# Patient Record
Sex: Male | Born: 1995 | Race: White | Hispanic: No | Marital: Single
Health system: Midwestern US, Community
[De-identification: ages and names within clinical notes are randomized; demographics above are authoritative.]

## PROBLEM LIST (undated history)

## (undated) DIAGNOSIS — R7303 Prediabetes: Secondary | ICD-10-CM

## (undated) DIAGNOSIS — E669 Obesity, unspecified: Secondary | ICD-10-CM

## (undated) DIAGNOSIS — R197 Diarrhea, unspecified: Secondary | ICD-10-CM

## (undated) DIAGNOSIS — K219 Gastro-esophageal reflux disease without esophagitis: Secondary | ICD-10-CM

## (undated) DIAGNOSIS — Z889 Allergy status to unspecified drugs, medicaments and biological substances status: Secondary | ICD-10-CM

## (undated) DIAGNOSIS — E785 Hyperlipidemia, unspecified: Secondary | ICD-10-CM

## (undated) DIAGNOSIS — F419 Anxiety disorder, unspecified: Secondary | ICD-10-CM

## (undated) DIAGNOSIS — R519 Headache, unspecified: Secondary | ICD-10-CM

## (undated) DIAGNOSIS — Z8489 Family history of other specified conditions: Secondary | ICD-10-CM

## (undated) DIAGNOSIS — I10 Essential (primary) hypertension: Secondary | ICD-10-CM

## (undated) DIAGNOSIS — K760 Fatty (change of) liver, not elsewhere classified: Secondary | ICD-10-CM

## (undated) DIAGNOSIS — R51 Headache: Secondary | ICD-10-CM

## (undated) DIAGNOSIS — E119 Type 2 diabetes mellitus without complications: Secondary | ICD-10-CM

## (undated) HISTORY — PX: ORIF TIBIA & FIBULA FRACTURES: SHX2131

## (undated) HISTORY — DX: Hyperlipidemia, unspecified: E78.5

## (undated) HISTORY — PX: WISDOM TOOTH EXTRACTION: SHX21

## (undated) HISTORY — PX: TONSILLECTOMY AND ADENOIDECTOMY: SUR1326

## (undated) HISTORY — PX: CIRCUMCISION: SUR203

## (undated) HISTORY — DX: Prediabetes: R73.03

## (undated) HISTORY — DX: Obesity, unspecified: E66.9

---
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
## 2017-09-19 ENCOUNTER — Other Ambulatory Visit: Payer: Self-pay | Admitting: Gastroenterology

## 2017-09-24 ENCOUNTER — Encounter (HOSPITAL_COMMUNITY): Payer: Self-pay | Admitting: *Deleted

## 2017-09-24 ENCOUNTER — Other Ambulatory Visit: Payer: Self-pay

## 2017-09-24 NOTE — H&P (Signed)
History of Present Illness General: 22/male was referred for diarrhea. He reports symptoms for many years, intermittent diarrhea, fecal urgency, he has burning upper abdomen and generalized abdominal pain. It likely started at least 7 years, he normally has 3-5 bms/day, it is watery, non bloody, not black, occasionally he has formed stools, episodes of diarrhea usually occur 1-2/week, they last for half a day, he has nocturnal diarrhea and abdominal pain. He has acid reflux and takes TUMS as needed and it helps, denies difficulty or pain on swallowing. Denies weight fluctuations, he has a good appetite. He denies food triggers. No skin rash, no joint pain or oral uclers. There is family history of Crohn, UC, Celiac, colon cancer. No prior EGD or colonoscopy.Abdominal pain is etiher in upper abdomen or lower abdomen and may improve after a BM. It can be dull or sharp. He is on a probiotic and it has not made much of a difference.  Current Medications Taking Vitamin B-12 ER 1000 MCG Tablet Extended Release Oral Lisinopril 20 MG Tablet Oral Atorvastatin Calcium 20 MG Tablet Oral Fexofenadine HCl 180 MG Tablet 1 tablet as needed Orally Once a day Vitamin D (Cholecalciferol) 1000 UNIT Tablet 1 tablet Orally Once a day Probiotic - Capsule as directed Orally Tums(Calcium Carbonate Antacid) 500 MG Tablet Chewable 1 tablet Orally Once a day Medication List reviewed and reconciled with the patient Past Medical History Hypertension. Prediabetes; when he was teenage. Asthma, Child. Surgical History Tonsillectomy and Adenoidectomy Fracture tiba and fibula; right leg 2007 Perianal Cyst removed Family History Mother: alive Paternal Grand Father: Colon cancer Maternal Grand Father: Liver cancer No Family History of Polyps. Social History General: Tobacco use cigarettes: Never smoked Tobacco history last updated 09/19/2017 Alcohol: occasionally, beer. Recreational drug use:  no. Allergies N.K.D.A. Hospitalization/Major Diagnostic Procedure childbirth Not in the past year 09/2017 Review of Systems CONSTITUTIONAL: Chills No. Fatigue No. Fever No. Insomnia No. Night sweats No. Weightchange No. HEENT: Change in vision No. Double vision No . Hoarseness No. Nose bleeds No. sore throat No. Sinus Problems No. Glaucoma No. CARDIOLOGY: ByPass Surgery No. Poor Circulation No. Artificial Heart Valves No. High blood pressure YES. History of Heart attack No. Irregular heart beat No. Known coronary artery disease No. Pacemaker/Defibrillator No. RESPIRATORY: Shortness of breath No. Cough YES. Excessive Sputum No. Using Oxygen No. Asthma YES. Bronchitis No. Pneumonia No. Sleep Apnea No. UROLOGY: Interstitials Cystitis No. Incontinence No. Prostate problems No. Blood in urine No. Difficulty urinating No. Kidney disease No. Kidney stones No. GASTROENTEROLOGY: Abdominal pain YES. Acid reflux YES. Black stools No. Bloating/belching YES. Change in bowel habits YES. Constipation No. Dark tarry stools No. Diarrhea YES. Difficulty swallowing No. Heartburn YES. Incontinence of stool No. Indigestion: YES. Lactose intolerance No. Nausea No. Rectal bleeding No. Vomiting No. Hepatitis/yellow jaundice No. History of Ulcers No. Use of Pain Medication No. Previous Colonoscopy No. MUSCULOSKELETAL: joint pain No. Arthritis No. Joint Replacement No. NEUROLOGY: Dizziness No. Fainting No. Headache No. Paralysis No. Seizures No. Strokes No. Weakness No. Alzheimer's No. SKIN: Rash No. Bruises easily No. ENDOCRINOLOGY: Diabetes No. High cholesterol No. Thyroid disorder No. HEMATOLOGY/LYMPH: Abnormal bleeding No. Anemia No. Enlarged lymph nodes No. Past blood transfusion No. Swollen glands No. Blood Clots No. Using Blood Thinners No. INFECTIOUS DISEASE: HIV/AIDS No. Tuberculosis No . Hepatitis No. Sexually Transmitted Disease No. MRSA No. GI PROCEDURE: no  Pacemaker/ AICD, no. no Artificial heart valves. no MI/heart attack. no Abnormal heart rhythm. no Angina. no CVA. Hypertension YES. no Hypotension. no Asthma, COPD. no  Sleep apnea. no Seizure disorders. no Artificial joints. no Severe DJD. no Diabetes. no Significant headaches. no Vertigo.  Vital Signs Wt 408.2, Ht 72, BMI 55.36, Temp 99.5, Pulse sitting 118, BP sitting 129/79. Examination Gastroenterology:: GENERAL APPEARANCE: Well developed,obese, pleasant, no acute distress. EYES: Lids and conjunctiva normal. Sclera normal. ORAL CAVITY: Lips, teeth and gums are normal. Pharynx, tongue, mucosa normal . SCLERA: anicteric . NECK Full ROM, trachea midline, no thyromegaly or masses . CARDIOVASCULAR PMI LS border. Normal RRR w/o murmers or gallops. No peripheral edema . RESPIRATORY Breath sounds normal. Respiration even and unlabored . ABDOMEN No masses palpated. Liver and spleen not palpated, normal. Bowel sounds normal, Abdomen not distended . EXTREMITIES: No edema, pulses intact . NEURO: normal strength and reflexes, cranial nerves II-XII grossly intact, normal gait . PSYCH: mood/affect normal .   Assessments 1. Diarrhea, unspecified type - R19.7 (Primary) 2. Fatty liver - K76.0   Treatment  1. Diarrhea, unspecified type LAB: C difficile Toxins A+B, EIA (161096) (Ordered for 09/19/2017) LAB: Ova + Parasite; Giardia EIA ([id number]) (Ordered for 09/19/2017) IMAGING: Colonoscopy Whitfield,Dia 09/19/2017 04:05:59 PM > spoke with Kendallscheduled for 09/25/17-[facility] at 9am-prep instructions reviewed with pt. IMAGING: Esophagoscopy Whitfield,Dia 09/19/2017 04:05:59 PM > spoke with Kendallscheduled for 09/25/17-[facility] at 9am-prep instructions reviewed with pt. Notes: Less likely to be infectious as this is been ongoing for several years unless he has spores for C diff or chronic giardia. Will proceed with a diagnostic endoscopy and small bowel biopsies to rule out celiac disease along  with a colonoscopy and random colon biopsies. The risk and benefits of the procedure we discussed with the patient in details. He understands and verbalizes consent. He will be given written instructions, prescription for her preparation and will be scheduled for the same. Referral To: Reason:endo-colon-propofol-spoke with Kendall-[facility]-#[id number]  2. Fatty liver Notes: Patient states that he recently had an ultrasound by his primary care physician which showed fatty liver and was told that his AST/ALT was slightly elevated. Discussed about importance of weight loss, a fatty liver is unchecked there is risk of developing fibrosis and cirrhosis. He seems to understand. Advised to take vitamin E 400 units 2 capsules daily.

## 2017-09-24 NOTE — Progress Notes (Signed)
Pt denies SOB, chest pain, and being under the care of a cardiologist. Pt denies having a stress test, echo and cardiac cath. Pt denies having an EKG and chest x ray within the last year. Requested recent labs and LOV note from PCP, [place]. Pt made aware to stop taking vitamins, fish oil and herbal medications. Do not take any NSAIDs ie: Ibuprofen, Advil, Naproxen (Aleve), Motrin, BC and Goody Powder. Pt verbalized understanding of all pre-op instructions.

## 2017-09-24 NOTE — Progress Notes (Signed)
   09/24/17 1409  OBSTRUCTIVE SLEEP APNEA  Have you ever been diagnosed with sleep apnea through a sleep study? No  Do you snore loudly (loud enough to be heard through closed doors)?  0  Do you often feel tired, fatigued, or sleepy during the daytime (such as falling asleep during driving or talking to someone)? 0  Has anyone observed you stop breathing during your sleep? 0  Do you have, or are you being treated for high blood pressure? 1  BMI more than 35 kg/m2? 1  Age > 50 (1-yes) 0  Neck circumference greater than:Male 16 inches or larger, Male 17inches or larger? 1  Male Gender (Yes=1) 1  Obstructive Sleep Apnea Score 4

## 2017-09-25 ENCOUNTER — Encounter (HOSPITAL_COMMUNITY): Payer: Self-pay

## 2017-09-25 ENCOUNTER — Ambulatory Visit (HOSPITAL_COMMUNITY): Payer: BLUE CROSS/BLUE SHIELD | Admitting: Anesthesiology

## 2017-09-25 ENCOUNTER — Ambulatory Visit (HOSPITAL_COMMUNITY)
Admission: RE | Admit: 2017-09-25 | Discharge: 2017-09-25 | Disposition: A | Discharge status: Home / Self Care | Payer: BLUE CROSS/BLUE SHIELD | Source: Ambulatory Visit | Attending: Gastroenterology | Admitting: Gastroenterology

## 2017-09-25 ENCOUNTER — Encounter (HOSPITAL_COMMUNITY)
Admission: RE | Disposition: A | Discharge status: Home / Self Care | Payer: Self-pay | Source: Ambulatory Visit | Attending: Gastroenterology

## 2017-09-25 DIAGNOSIS — K760 Fatty (change of) liver, not elsewhere classified: Secondary | ICD-10-CM | POA: Insufficient documentation

## 2017-09-25 DIAGNOSIS — K219 Gastro-esophageal reflux disease without esophagitis: Secondary | ICD-10-CM | POA: Insufficient documentation

## 2017-09-25 DIAGNOSIS — K529 Noninfective gastroenteritis and colitis, unspecified: Secondary | ICD-10-CM | POA: Insufficient documentation

## 2017-09-25 DIAGNOSIS — K5730 Diverticulosis of large intestine without perforation or abscess without bleeding: Secondary | ICD-10-CM | POA: Diagnosis not present

## 2017-09-25 DIAGNOSIS — Z6841 Body Mass Index (BMI) 40.0 and over, adult: Secondary | ICD-10-CM | POA: Diagnosis not present

## 2017-09-25 DIAGNOSIS — Z800 Family history of malignant neoplasm of digestive organs: Secondary | ICD-10-CM | POA: Diagnosis not present

## 2017-09-25 DIAGNOSIS — Z79899 Other long term (current) drug therapy: Secondary | ICD-10-CM | POA: Insufficient documentation

## 2017-09-25 DIAGNOSIS — R1084 Generalized abdominal pain: Secondary | ICD-10-CM | POA: Insufficient documentation

## 2017-09-25 DIAGNOSIS — R152 Fecal urgency: Secondary | ICD-10-CM | POA: Insufficient documentation

## 2017-09-25 DIAGNOSIS — I10 Essential (primary) hypertension: Secondary | ICD-10-CM | POA: Diagnosis not present

## 2017-09-25 HISTORY — DX: Family history of other specified conditions: Z84.89

## 2017-09-25 HISTORY — PX: ESOPHAGOGASTRODUODENOSCOPY: SHX5428

## 2017-09-25 HISTORY — DX: Allergy status to unspecified drugs, medicaments and biological substances: Z88.9

## 2017-09-25 HISTORY — DX: Essential (primary) hypertension: I10

## 2017-09-25 HISTORY — DX: Headache: R51

## 2017-09-25 HISTORY — PX: COLONOSCOPY: SHX5424

## 2017-09-25 HISTORY — DX: Diarrhea, unspecified: R19.7

## 2017-09-25 HISTORY — DX: Headache, unspecified: R51.9

## 2017-09-25 HISTORY — PX: BIOPSY: SHX5522

## 2017-09-25 HISTORY — DX: Gastro-esophageal reflux disease without esophagitis: K21.9

## 2017-09-25 HISTORY — DX: Fatty (change of) liver, not elsewhere classified: K76.0

## 2017-09-25 SURGERY — COLONOSCOPY
Anesthesia: Monitor Anesthesia Care

## 2017-09-25 MED ORDER — LIDOCAINE 2% (20 MG/ML) 5 ML SYRINGE
INTRAMUSCULAR | Status: DC | PRN
  Administered 2017-09-25: 100 mg via INTRAVENOUS

## 2017-09-25 MED ORDER — PROPOFOL 10 MG/ML IV BOLUS
INTRAVENOUS | Status: DC | PRN
  Administered 2017-09-25 (×4): 50 mg via INTRAVENOUS
  Administered 2017-09-25: 30 mg via INTRAVENOUS
  Administered 2017-09-25: 40 mg via INTRAVENOUS
  Administered 2017-09-25 (×2): 50 mg via INTRAVENOUS

## 2017-09-25 MED ORDER — GLYCOPYRROLATE PF 0.2 MG/ML IJ SOSY
PREFILLED_SYRINGE | INTRAMUSCULAR | Status: DC | PRN
  Administered 2017-09-25: .3 mg via INTRAVENOUS

## 2017-09-25 MED ORDER — LACTATED RINGERS IV SOLN
INTRAVENOUS | Status: DC
  Administered 2017-09-25 (×2): via INTRAVENOUS

## 2017-09-25 MED ORDER — PROPOFOL 500 MG/50ML IV EMUL
INTRAVENOUS | Status: DC | PRN
  Administered 2017-09-25: 10:00:00 via INTRAVENOUS
  Administered 2017-09-25: 125 ug/kg/min via INTRAVENOUS

## 2017-09-25 NOTE — Anesthesia Postprocedure Evaluation (Signed)
Anesthesia Post Note  Patient: Mark Small  Procedure(s) Performed: COLONOSCOPY (N/A ) ESOPHAGOGASTRODUODENOSCOPY (EGD) (N/A ) BIOPSY     Patient location during evaluation: Endoscopy Anesthesia Type: MAC Level of consciousness: awake and alert Pain management: pain level controlled Vital Signs Assessment: post-procedure vital signs reviewed and stable Respiratory status: spontaneous breathing, nonlabored ventilation, respiratory function stable and patient connected to nasal cannula oxygen Cardiovascular status: stable and blood pressure returned to baseline Postop Assessment: no apparent nausea or vomiting Anesthetic complications: no    Last Vitals:  Vitals:   09/25/17 1030 09/25/17 1040  BP: 129/69 (P) 124/71  Pulse: 100 100  Resp: 17 (!) 22  Temp:    SpO2: 96% 100%    Last Pain:  Vitals:   09/25/17 1040  TempSrc:   PainSc: 0-No pain                 Hanadi Stanly

## 2017-09-25 NOTE — Op Note (Signed)
[facility] Patient Name: Mark Small Procedure Date : 09/25/2017 MRN: [id number] Attending MD: Kerin Salen , MD Date of Birth: 1995/04/28 CSN: [id number] Age: 22 Admit Type: Inpatient Procedure:                Colonoscopy Indications:              This is the patient's first colonoscopy, Chronic                            diarrhea Providers:                Kerin Salen, MD, Dwain Sarna, RN, Margo Aye,                            Technician Referring MD:              Medicines:                Monitored Anesthesia Care Complications:            No immediate complications. Estimated Blood Loss:     Estimated blood loss: none. Procedure:                Pre-Anesthesia Assessment:                           - Prior to the procedure, a History and Physical                            was performed, and patient medications and                            allergies were reviewed. The patient's tolerance of                            previous anesthesia was also reviewed. The risks                            and benefits of the procedure and the sedation                            options and risks were discussed with the patient.                            All questions were answered, and informed consent                            was obtained. Prior Anticoagulants: The patient has                            taken no previous anticoagulant or antiplatelet                            agents. ASA Grade Assessment: III - A patient with                            severe systemic disease.  After reviewing the risks                            and benefits, the patient was deemed in                            satisfactory condition to undergo the procedure.                           After obtaining informed consent, the colonoscope                            was passed under direct vision. Throughout the                            procedure, the patient's blood pressure, pulse, and                           oxygen saturations were monitored continuously. The                            EC-[id number] ([id number]) scope was introduced through                            the anus and advanced to the the terminal ileum.                            The colonoscopy was performed without difficulty.                            The patient tolerated the procedure well. The                            quality of the bowel preparation was good. Scope In: 10:01:26 AM Scope Out: 10:16:52 AM Scope Withdrawal Time: 0 hours 11 minutes 8 seconds  Total Procedure Duration: 0 hours 15 minutes 26 seconds  Findings:      The perianal and digital rectal examinations were normal.      The terminal ileum appeared normal.      A single small-mouthed diverticulum was found in the descending colon.      The exam was otherwise without abnormality.      Biopsies for histology were taken with a cold forceps from the random       areas of right and left colon for evaluation of microscopic colitis.      The retroflexed view of the distal rectum and anal verge was normal and       showed no anal or rectal abnormalities. Impression:               - The examined portion of the ileum was normal.                           - Diverticulosis in the descending colon.                           - The examination  was otherwise normal.                           - The distal rectum and anal verge are normal on                            retroflexion view.                           - Biopsies were taken with a cold forceps from the                            random areas of right and left colon for evaluation                            of microscopic colitis. Moderate Sedation:      Patient did not receive moderate sedation for this procedure, but       instead received monitored anesthesia care. Recommendation:           - Patient has a contact number available for                            emergencies. The signs and  symptoms of potential                            delayed complications were discussed with the                            patient. Return to normal activities tomorrow.                            Written discharge instructions were provided to the                            patient.                           - Low fiber diet.                           - Continue present medications.                           - Await pathology results.                           - Repeat colonoscopy at age 15 for screening                            purposes. Procedure Code(s):        --- Professional ---                           [id number], Colonoscopy, flexible; with biopsy, single  or multiple Diagnosis Code(s):        --- Professional ---                           K52.9, Noninfective gastroenteritis and colitis,                            unspecified                           K57.30, Diverticulosis of large intestine without                            perforation or abscess without bleeding CPT copyright 2017 American Medical Association. All rights reserved. The codes documented in this report are preliminary and upon coder review may  be revised to meet current compliance requirements. Kerin SalenArya Hendrik Donath, MD 09/25/2017 10:24:46 AM This report has been signed electronically. Number of Addenda: 0

## 2017-09-25 NOTE — Brief Op Note (Signed)
09/25/2017  10:25 AM  PATIENT:  Mark Small  22 y.o. male  PRE-OPERATIVE DIAGNOSIS:  Diarrhea, unspecified type  POST-OPERATIVE DIAGNOSIS:  mild gastritis/colon diverticulum  PROCEDURE:  Procedure(s): COLONOSCOPY (N/A) ESOPHAGOGASTRODUODENOSCOPY (EGD) (N/A) BIOPSY  SURGEON:  Surgeon(s) and Role:    Ronnette Juniper, MD - Primary  PHYSICIAN ASSISTANT:   ASSISTANTS: Cleda Daub, RN, Alan Mulder, Tech  ANESTHESIA:   MAC  EBL:  Minimal  BLOOD ADMINISTERED:none  DRAINS: none   LOCAL MEDICATIONS USED:  NONE  SPECIMEN:  Biopsy / Limited Resection  DISPOSITION OF SPECIMEN:  PATHOLOGY  COUNTS:  YES  TOURNIQUET:  * No tourniquets in log *  DICTATION: .Dragon Dictation  PLAN OF CARE: Discharge to home after PACU  PATIENT DISPOSITION:  PACU - hemodynamically stable.   Delay start of Pharmacological VTE agent (>24hrs) due to surgical blood loss or risk of bleeding: no

## 2017-09-25 NOTE — Op Note (Signed)
[facility] Patient Name: Mark Small Procedure Date : 09/25/2017 MRN: [id number] Attending MD: Kerin Salen , MD Date of Birth: 10/11/1995 CSN: [id number] Age: 22 Admit Type: Inpatient Procedure:                Upper GI endoscopy Indications:              Diarrhea Providers:                Kerin Salen, MD, Dwain Sarna, RN, Margo Aye,                            Technician Referring MD:              Medicines:                Monitored Anesthesia Care Complications:            No immediate complications. Estimated Blood Loss:     Estimated blood loss: none. Procedure:                Pre-Anesthesia Assessment:                           - Prior to the procedure, a History and Physical                            was performed, and patient medications and                            allergies were reviewed. The patient's tolerance of                            previous anesthesia was also reviewed. The risks                            and benefits of the procedure and the sedation                            options and risks were discussed with the patient.                            All questions were answered, and informed consent                            was obtained. Prior Anticoagulants: The patient has                            taken no previous anticoagulant or antiplatelet                            agents. ASA Grade Assessment: III - A patient with                            severe systemic disease. After reviewing the risks                            and  benefits, the patient was deemed in                            satisfactory condition to undergo the procedure.                           After obtaining informed consent, the endoscope was                            passed under direct vision. Throughout the                            procedure, the patient's blood pressure, pulse, and                            oxygen saturations were monitored continuously.  The                            [id number] ([id number] scope was introduced through the                            mouth, and advanced to the second part of duodenum.                            The upper GI endoscopy was accomplished without                            difficulty. The patient tolerated the procedure                            well. Scope In: Scope Out: Findings:      The examined esophagus was normal.      The Z-line was regular and was found 42 cm from the incisors.      Localized mildly erythematous mucosa without bleeding was found in the       gastric antrum. Biopsies were taken with a cold forceps for Helicobacter       pylori testing.      The cardia and gastric fundus were normal on retroflexion.      The examined duodenum was normal. Biopsies for histology were taken with       a cold forceps for evaluation of celiac disease. Impression:               - Normal esophagus.                           - Z-line regular, 42 cm from the incisors.                           - Erythematous mucosa in the antrum. Biopsied.                           - Normal examined duodenum. Biopsied. Moderate Sedation:      Patient did not receive moderate sedation for this procedure, but       instead received monitored anesthesia care. Recommendation:           -  Patient has a contact number available for                            emergencies. The signs and symptoms of potential                            delayed complications were discussed with the                            patient. Return to normal activities tomorrow.                            Written discharge instructions were provided to the                            patient.                           - Low fiber diet.                           - Continue present medications.                           - Await pathology results. Procedure Code(s):        --- Professional ---                           [phone],  Esophagogastroduodenoscopy, flexible,                            transoral; with biopsy, single or multiple Diagnosis Code(s):        --- Professional ---                           K31.89, Other diseases of stomach and duodenum                           R19.7, Diarrhea, unspecified CPT copyright 2017 American Medical Association. All rights reserved. The codes documented in this report are preliminary and upon coder review may  be revised to meet current compliance requirements. Kerin Salen, MD 09/25/2017 10:22:19 AM This report has been signed electronically. Number of Addenda: 0

## 2017-09-25 NOTE — Discharge Instructions (Signed)

## 2017-09-25 NOTE — Transfer of Care (Signed)
Immediate Anesthesia Transfer of Care Note  Patient: Mark Small  Procedure(s) Performed: COLONOSCOPY (N/A ) ESOPHAGOGASTRODUODENOSCOPY (EGD) (N/A ) BIOPSY  Patient Location: PACU and Endoscopy Unit  Anesthesia Type:MAC  Level of Consciousness: awake, alert  and oriented  Airway & Oxygen Therapy: Patient Spontanous Breathing and Patient connected to nasal cannula oxygen  Post-op Assessment: Report given to RN and Post -op Vital signs reviewed and stable  Post vital signs: Reviewed and stable  Last Vitals:  Vitals Value Taken Time  BP    Temp    Pulse    Resp    SpO2      Last Pain:  Vitals:   09/25/17 0727  TempSrc: Oral  PainSc: 0-No pain         Complications: No apparent anesthesia complications

## 2017-09-25 NOTE — Anesthesia Preprocedure Evaluation (Signed)
Anesthesia Evaluation  Patient identified by MRN, date of birth, ID band Patient awake    Reviewed: Allergy & Precautions, NPO status , Patient's Chart, lab work & pertinent test results  History of Anesthesia Complications Negative for: history of anesthetic complications  Airway Mallampati: I  TM Distance: >3 FB Neck ROM: Full    Dental  (+) Teeth Intact   Pulmonary asthma ,    breath sounds clear to auscultation       Cardiovascular hypertension, Pt. on medications  Rhythm:Regular     Neuro/Psych  Headaches, negative psych ROS   GI/Hepatic Neg liver ROS, GERD  ,  Endo/Other  Morbid obesity  Renal/GU negative Renal ROS     Musculoskeletal   Abdominal   Peds  Hematology negative hematology ROS (+)   Anesthesia Other Findings   Reproductive/Obstetrics                             Anesthesia Physical Anesthesia Plan  ASA: III  Anesthesia Plan: MAC   Post-op Pain Management:    Induction: Intravenous  PONV Risk Score and Plan: 1 and Treatment may vary due to age or medical condition  Airway Management Planned: Nasal Cannula  Additional Equipment: None  Intra-op Plan:   Post-operative Plan:   Informed Consent: I have reviewed the patients History and Physical, chart, labs and discussed the procedure including the risks, benefits and alternatives for the proposed anesthesia with the patient or authorized representative who has indicated his/her understanding and acceptance.   Dental advisory given  Plan Discussed with: CRNA and Surgeon  Anesthesia Plan Comments:         Anesthesia Quick Evaluation

## 2017-09-25 NOTE — Interval H&P Note (Signed)
History and Physical Interval Note: 22/male with chronic diarrhea for an EGD with small bowel biopsies and colonoscopy with random colon biospies. 09/25/2017 8:04 AM  Mark Small  has presented today for EGD and colonoscopy, with the diagnosis of Diarrhea, unspecified type  The various methods of treatment have been discussed with the patient and family. After consideration of risks, benefits and other options for treatment, the patient has consented to  Procedure(s): COLONOSCOPY (N/A) ESOPHAGOGASTRODUODENOSCOPY (EGD) (N/A) as a surgical intervention .  The patient's history has been reviewed, patient examined, no change in status, stable for surgery.  I have reviewed the patient's chart and labs.  Questions were answered to the patient's satisfaction.     Kerin SalenArya Nashla Althoff

## 2017-09-26 ENCOUNTER — Encounter (HOSPITAL_COMMUNITY): Payer: Self-pay | Admitting: Gastroenterology

## 2018-04-19 ENCOUNTER — Emergency Department: Admit: 2018-04-19 | Payer: BLUE CROSS/BLUE SHIELD

## 2018-04-19 ENCOUNTER — Inpatient Hospital Stay
Admit: 2018-04-19 | Discharge: 2018-04-19 | Disposition: A | Discharge status: Home / Self Care | Payer: BLUE CROSS/BLUE SHIELD | Attending: Emergency Medicine

## 2018-04-19 DIAGNOSIS — N200 Calculus of kidney: Secondary | ICD-10-CM

## 2018-04-19 LAB — CBC WITH AUTO DIFFERENTIAL
Basophils %: 0 % (ref 0–2)
Basophils Absolute: 0 10*3/uL (ref 0.0–0.1)
Eosinophils %: 5 % (ref 0–5)
Eosinophils Absolute: 0.9 10*3/uL — ABNORMAL HIGH (ref 0.0–0.4)
Hematocrit: 44.1 % (ref 36.0–48.0)
Hemoglobin: 14.6 g/dL (ref 13.0–16.0)
Lymphocytes %: 11 % — ABNORMAL LOW (ref 21–52)
Lymphocytes Absolute: 1.8 10*3/uL (ref 0.9–3.6)
MCH: 29.8 PG (ref 24.0–34.0)
MCHC: 33.1 g/dL (ref 31.0–37.0)
MCV: 90 FL (ref 74.0–97.0)
MPV: 10.1 FL (ref 9.2–11.8)
Monocytes %: 7 % (ref 3–10)
Monocytes Absolute: 1.1 10*3/uL (ref 0.05–1.2)
Neutrophils %: 77 % — ABNORMAL HIGH (ref 40–73)
Neutrophils Absolute: 12.8 10*3/uL — ABNORMAL HIGH (ref 1.8–8.0)
Platelets: 267 10*3/uL (ref 135–420)
RBC: 4.9 M/uL (ref 4.70–5.50)
RDW: 13.6 % (ref 11.6–14.5)
WBC: 16.6 10*3/uL — ABNORMAL HIGH (ref 4.6–13.2)

## 2018-04-19 LAB — COMPREHENSIVE METABOLIC PANEL
ALT: 68 U/L — ABNORMAL HIGH (ref 16–61)
AST: 19 U/L (ref 10–38)
Albumin/Globulin Ratio: 1.1 (ref 0.8–1.7)
Albumin: 4 g/dL (ref 3.4–5.0)
Alkaline Phosphatase: 74 U/L (ref 45–117)
Anion Gap: 10 mmol/L (ref 3.0–18)
BUN: 18 MG/DL (ref 7.0–18)
Bun/Cre Ratio: 18 (ref 12–20)
CO2: 23 mmol/L (ref 21–32)
Calcium: 9.5 MG/DL (ref 8.5–10.1)
Chloride: 106 mmol/L (ref 100–111)
Creatinine: 1.01 MG/DL (ref 0.6–1.3)
EGFR IF NonAfrican American: >60 mL/min/{1.73_m2} (ref >60)
GFR African American: >60 mL/min/{1.73_m2} (ref >60)
Globulin: 3.5 g/dL (ref 2.0–4.0)
Glucose: 182 mg/dL — ABNORMAL HIGH (ref 74–99)
Potassium: 3.9 mmol/L (ref 3.5–5.5)
Sodium: 139 mmol/L (ref 136–145)
Total Bilirubin: 0.5 MG/DL (ref 0.2–1.0)
Total Protein: 7.5 g/dL (ref 6.4–8.2)

## 2018-04-19 LAB — URINALYSIS W/ RFLX MICROSCOPIC
Glucose, Ur: NEGATIVE mg/dL
Glucose: NEGATIVE mg/dL
Nitrite, Urine: NEGATIVE
Nitrites: NEGATIVE
Protein, UA: 100 mg/dL — AB
Protein: 100 mg/dL — AB
Specific Gravity, UA: >1.03 NA — ABNORMAL HIGH (ref 1.005–1.030)
Specific gravity: >1.03 — ABNORMAL HIGH (ref 1.005–1.030)
Urobilinogen, UA, POCT: 1 EU/dL (ref 0.2–1.0)
Urobilinogen: 1 EU/dL (ref 0.2–1.0)
pH (UA): 5.5 (ref 5.0–8.0)
pH, UA: 5.5 (ref 5.0–8.0)

## 2018-04-19 LAB — URINE MICROSCOPIC ONLY
RBC, UA: 11 /hpf (ref 0–5)
RBC: 11 /hpf (ref 0–5)
WBC, UA: 0 /hpf (ref 0–4)
WBC: 0 /hpf (ref 0–4)

## 2018-04-19 LAB — METABOLIC PANEL, COMPREHENSIVE
A-G Ratio: 1.1 (ref 0.8–1.7)
ALT (SGPT): 68 U/L — ABNORMAL HIGH (ref 16–61)
AST (SGOT): 19 U/L (ref 10–38)
Albumin: 4 g/dL (ref 3.4–5.0)
Alk. phosphatase: 74 U/L (ref 45–117)
Anion gap: 10 mmol/L (ref 3.0–18)
BUN/Creatinine ratio: 18 (ref 12–20)
BUN: 18 MG/DL (ref 7.0–18)
Bilirubin, total: 0.5 MG/DL (ref 0.2–1.0)
CO2: 23 mmol/L (ref 21–32)
Calcium: 9.5 MG/DL (ref 8.5–10.1)
Chloride: 106 mmol/L (ref 100–111)
Creatinine: 1.01 MG/DL (ref 0.6–1.3)
GFR est AA: >60 mL/min/{1.73_m2} (ref >60)
GFR est non-AA: >60 mL/min/{1.73_m2} (ref >60)
Globulin: 3.5 g/dL (ref 2.0–4.0)
Glucose: 182 mg/dL — ABNORMAL HIGH (ref 74–99)
Potassium: 3.9 mmol/L (ref 3.5–5.5)
Protein, total: 7.5 g/dL (ref 6.4–8.2)
Sodium: 139 mmol/L (ref 136–145)

## 2018-04-19 LAB — CBC WITH AUTOMATED DIFF
ABS. BASOPHILS: 0 10*3/uL (ref 0.0–0.1)
ABS. EOSINOPHILS: 0.9 10*3/uL — ABNORMAL HIGH (ref 0.0–0.4)
ABS. LYMPHOCYTES: 1.8 10*3/uL (ref 0.9–3.6)
ABS. MONOCYTES: 1.1 10*3/uL (ref 0.05–1.2)
ABS. NEUTROPHILS: 12.8 10*3/uL — ABNORMAL HIGH (ref 1.8–8.0)
BASOPHILS: 0 % (ref 0–2)
EOSINOPHILS: 5 % (ref 0–5)
HCT: 44.1 % (ref 36.0–48.0)
HGB: 14.6 g/dL (ref 13.0–16.0)
LYMPHOCYTES: 11 % — ABNORMAL LOW (ref 21–52)
MCH: 29.8 PG (ref 24.0–34.0)
MCHC: 33.1 g/dL (ref 31.0–37.0)
MCV: 90 FL (ref 74.0–97.0)
MONOCYTES: 7 % (ref 3–10)
MPV: 10.1 FL (ref 9.2–11.8)
NEUTROPHILS: 77 % — ABNORMAL HIGH (ref 40–73)
PLATELET: 267 10*3/uL (ref 135–420)
RBC: 4.9 M/uL (ref 4.70–5.50)
RDW: 13.6 % (ref 11.6–14.5)
WBC: 16.6 10*3/uL — ABNORMAL HIGH (ref 4.6–13.2)

## 2018-04-19 MED ORDER — ONDANSETRON (PF) 4 MG/2 ML INJECTION
4 mg/2 mL | INTRAMUSCULAR | Status: DC
Start: 2018-04-19 — End: 2018-04-19

## 2018-04-19 MED ORDER — SODIUM CHLORIDE 0.9 % IV
Freq: Once | INTRAVENOUS | Status: DC
Start: 2018-04-19 — End: 2018-04-19

## 2018-04-19 MED ORDER — ONDANSETRON 4 MG TAB, RAPID DISSOLVE
4 mg | Freq: Once | ORAL | Status: AC
Start: 2018-04-19 — End: 2018-04-19
  Administered 2018-04-19: 21:00:00 via ORAL

## 2018-04-19 MED ORDER — ONDANSETRON 4 MG TAB, RAPID DISSOLVE
4 mg | ORAL_TABLET | ORAL | 0 refills | Status: AC
Start: 2018-04-19 — End: ?

## 2018-04-19 MED ORDER — ONDANSETRON 4 MG TAB, RAPID DISSOLVE
4 mg | ORAL | Status: AC
Start: 2018-04-19 — End: 2018-04-20

## 2018-04-19 MED ORDER — MORPHINE 4 MG/ML INTRAVENOUS SOLUTION
4 mg/mL | INTRAVENOUS | Status: AC
Start: 2018-04-19 — End: 2018-04-20

## 2018-04-19 MED ORDER — MORPHINE 4 MG/ML INTRAVENOUS SOLUTION
4 mg/mL | INTRAVENOUS | Status: DC
Start: 2018-04-19 — End: 2018-04-19

## 2018-04-19 MED ORDER — TAMSULOSIN SR 0.4 MG 24 HR CAP
0.4 mg | ORAL_CAPSULE | ORAL | 0 refills | Status: AC
Start: 2018-04-19 — End: ?

## 2018-04-19 MED ORDER — MORPHINE 4 MG/ML SYRINGE
4 mg/mL | INTRAMUSCULAR | Status: AC
Start: 2018-04-19 — End: 2018-04-19
  Administered 2018-04-19: 21:00:00 via INTRAMUSCULAR

## 2018-04-19 MED ORDER — ONDANSETRON (PF) 4 MG/2 ML INJECTION
4 mg/2 mL | INTRAMUSCULAR | Status: AC
Start: 2018-04-19 — End: 2018-04-20

## 2018-04-19 MED ORDER — KETOROLAC TROMETHAMINE 15 MG/ML INJECTION
15 mg/mL | INTRAMUSCULAR | Status: DC
Start: 2018-04-19 — End: 2018-04-19

## 2018-04-19 MED ORDER — KETOROLAC TROMETHAMINE 60 MG/2 ML IM
60 mg/2 mL | INTRAMUSCULAR | Status: AC
Start: 2018-04-19 — End: 2018-04-19
  Administered 2018-04-19: 21:00:00 via INTRAMUSCULAR

## 2018-04-19 MED ORDER — KETOROLAC TROMETHAMINE 15 MG/ML INJECTION
15 mg/mL | INTRAMUSCULAR | Status: AC
Start: 2018-04-19 — End: 2018-04-20

## 2018-04-19 MED ORDER — HYDROCODONE-ACETAMINOPHEN 5 MG-325 MG TAB
5-325 mg | ORAL_TABLET | Freq: Three times a day (TID) | ORAL | 0 refills | Status: AC | PRN
Start: 2018-04-19 — End: 2018-04-23

## 2018-04-19 MED FILL — ONDANSETRON (PF) 4 MG/2 ML INJECTION: 4 mg/2 mL | INTRAMUSCULAR | Qty: 2

## 2018-04-19 MED FILL — MORPHINE 4 MG/ML INTRAVENOUS SOLUTION: 4 mg/mL | INTRAVENOUS | Qty: 1

## 2018-04-19 MED FILL — ONDANSETRON 4 MG TAB, RAPID DISSOLVE: 4 mg | ORAL | Qty: 1

## 2018-04-19 MED FILL — SODIUM CHLORIDE 0.9 % IV: INTRAVENOUS | Qty: 1000

## 2018-04-19 MED FILL — KETOROLAC TROMETHAMINE 15 MG/ML INJECTION: 15 mg/mL | INTRAMUSCULAR | Qty: 1

## 2018-04-19 NOTE — ED Provider Notes (Signed)
ED Provider Notes by August Saucer, PA-C at 04/19/18 1611                Author: August Saucer, PA-C  Service: --  Author Type: Physician Assistant       Filed: 04/19/18 1719  Date of Service: 04/19/18 1611  Status: Attested           Editor: Ernestine Conrad (Physician Assistant)  Cosigner: Blake Divine, DO at 04/19/18 2200          Attestation signed by Blake Divine, DO at 04/19/18 2200          I was personally available for consultation in the emergency department.  I have reviewed the chart prior to the patient being discharged and agree with the  documentation recorded by the [facility], including the assessment, treatment plan, and disposition.   Blake Divine, DO                                  EMERGENCY DEPARTMENT HISTORY AND PHYSICAL EXAM      4:11 PM         Date: 04/19/2018   Patient Name: Dustin Neal        History of Presenting Illness          Chief Complaint       Patient presents with        ?  Abdominal Pain           History Provided By: Patient      Chief Complaint: left flank pain, nausea   Duration:  Hours   Timing:  Acute   Location:    Quality: Stabbing   Severity: 8 out of 10   Modifying Factors: none   Associated Symptoms: denies any other associated signs or symptoms         Additional History (Context):Dustin Neal  is a 23 y.o. male with a pertinent  history of morbid obesity, pre-diabetes, hypertension, asthma who presents to the emergency department for evaluation of severe left-sided flank pain that began this morning.  Pain radiates from the left mid back to the left flank.  Patient admits to  dark urine.  No history of kidney stones.  Patient was evaluated at urgent care just prior to arrival and told to present to the ED for evaluation for possible kidney stone.  Patient admits to nausea without vomiting.  No associated diarrhea, abdominal  pain, URI symptoms, dysuria, or any other complaints at this time.         PCP:  None           Current Outpatient Medications           Medication  Sig  Dispense  Refill           ?  tamsulosin (FLOMAX) 0.4 mg capsule  Take 0.4 mg by mouth daily.         ?  lisinopril (PRINIVIL, ZESTRIL) 20 mg tablet  Take 10 mg by mouth daily.         ?  atorvastatin (LIPITOR) 10 mg tablet  Take  by mouth daily.         ?  fexofenadine (ALLEGRA) 60 mg tablet  Take  by mouth.         ?  predniSONE (STERAPRED DS) 10 mg dose pack  Take  by mouth See Admin Instructions. See administration instruction per  26m dose pack         ?  cyanocobalamin (VITAMIN B-12) 100 mcg tablet  Take 100 mcg by mouth daily.         ?  HYDROcodone-acetaminophen (NORCO) 5-325 mg per tablet  Take 1 Tab by mouth every eight (8) hours as needed for Pain for up to 4 days. Max Daily Amount: 3 Tabs.  12 Tab  0     ?  ondansetron (ZOFRAN ODT) 4 mg disintegrating tablet  Take 1-2 tablets every 6-8 hours as needed for nausea and vomiting.  10 Tab  0           ?  tamsulosin (FLOMAX) 0.4 mg capsule  Take 1 tab by mouth daily until kidney stone passes.  10 Cap  0             Past History        Past Medical History:     Past Medical History:        Diagnosis  Date         ?  Asthma       ?  Hypertension           ?  Morbid (severe) obesity due to excess calories ([facility]             Past Surgical History:   History reviewed. No pertinent surgical history.      Family History:   History reviewed. No pertinent family history.      Social History:     Social History          Tobacco Use         ?  Smoking status:  Never Smoker     ?  Smokeless tobacco:  Former [employer], agriculturalUse Topics         ?  Alcohol use:  Yes             Comment: social         ?  Drug use:  Not Currently           Allergies:   No Known Allergies           Review of Systems        Review of Systems    Constitutional: Negative for chills and fever.    HENT: Negative for congestion, rhinorrhea and sore throat.     Respiratory: Negative for cough and shortness of breath.     Cardiovascular: Negative for chest pain.     Gastrointestinal: Positive for nausea. Negative for abdominal pain, blood in stool, constipation, diarrhea and vomiting.    Genitourinary: Positive for flank pain and hematuria . Negative for dysuria and frequency.    Musculoskeletal: Negative for back pain and myalgias.    Skin: Negative for rash and wound.    Neurological: Negative for dizziness and headaches.    All other systems reviewed and are negative.           Physical Exam        Visit Vitals      BP  (!) 151/109 (BP 1 Location: Left arm)     Pulse  (!) 101     Temp  98.3 ??F (36.8 ??C)     Resp  20     Ht  6' (1.829 m)     Wt  (!) 195 kg (430 lb)     SpO2  98%  BMI  58.32 kg/m??           Physical Exam   Vitals signs and nursing note reviewed.   Constitutional:        General: He is in acute distress.      Appearance: He is well-developed. He is  diaphoretic.      Comments: Pacing, grimacing, diaphoretic, bent over counter    HENT :       Head: Normocephalic and atraumatic.   Eyes :       Conjunctiva/sclera: Conjunctivae normal.   Neck :       Musculoskeletal: Normal range of motion and neck supple.   Cardiovascular :       Rate and Rhythm: Normal rate and regular rhythm.      Heart sounds: Normal heart sounds.    Pulmonary:       Effort: Pulmonary effort is normal. No respiratory distress.      Breath sounds: Normal breath sounds.   Chest:       Chest wall: No tenderness.   Abdominal :      General: Bowel sounds are normal. There is no distension.      Palpations: Abdomen is soft.      Tenderness: There is no tenderness. There is no guarding or rebound.     Musculoskeletal: Normal range of motion.          General: No deformity.    Skin:      General: Skin is warm.   Neurological :       Mental Status: He is alert and oriented to person, place, and time.           Diagnostic Study Results        Labs -     Recent Results (from the past 12 hour(s))     METABOLIC PANEL, COMPREHENSIVE          Collection Time: 04/19/18  2:35 PM         Result  Value  Ref  Range            Sodium  139  136 - 145 mmol/L       Potassium  3.9  3.5 - 5.5 mmol/L       Chloride  106  100 - 111 mmol/L       CO2  23  21 - 32 mmol/L       Anion gap  10  3.0 - 18 mmol/L       Glucose  182 (H)  74 - 99 mg/dL       BUN  18  7.0 - 18 MG/DL       Creatinine  1.01  0.6 - 1.3 MG/DL       BUN/Creatinine ratio  18  12 - 20         GFR est AA  >60  >60 ml/min/1.58m       GFR est non-AA  >60  >60 ml/min/1.730m      Calcium  9.5  8.5 - 10.1 MG/DL       Bilirubin, total  0.5  0.2 - 1.0 MG/DL       ALT (SGPT)  68 (H)  16 - 61 U/L       AST (SGOT)  19  10 - 38 U/L       Alk. phosphatase  74  45 - 117 U/L       Protein, total  7.5  6.4 -  8.2 g/dL       Albumin  4.0  3.4 - 5.0 g/dL       Globulin  3.5  2.0 - 4.0 g/dL       A-G Ratio  1.1  0.8 - 1.7         CBC WITH AUTOMATED DIFF          Collection Time: 04/19/18  2:37 PM         Result  Value  Ref Range            WBC  16.6 (H)  4.6 - 13.2 K/uL       RBC  4.90  4.70 - 5.50 M/uL       HGB  14.6  13.0 - 16.0 g/dL       HCT  44.1  36.0 - 48.0 %       MCV  90.0  74.0 - 97.0 FL       MCH  29.8  24.0 - 34.0 PG       MCHC  33.1  31.0 - 37.0 g/dL       RDW  13.6  11.6 - 14.5 %       PLATELET  267  135 - 420 K/uL       MPV  10.1  9.2 - 11.8 FL       NEUTROPHILS  77 (H)  40 - 73 %       LYMPHOCYTES  11 (L)  21 - 52 %       MONOCYTES  7  3 - 10 %       EOSINOPHILS  5  0 - 5 %       BASOPHILS  0  0 - 2 %       ABS. NEUTROPHILS  12.8 (H)  1.8 - 8.0 K/UL       ABS. LYMPHOCYTES  1.8  0.9 - 3.6 K/UL       ABS. MONOCYTES  1.1  0.05 - 1.2 K/UL       ABS. EOSINOPHILS  0.9 (H)  0.0 - 0.4 K/UL       ABS. BASOPHILS  0.0  0.0 - 0.1 K/UL       DF  AUTOMATED          URINALYSIS W/ RFLX MICROSCOPIC          Collection Time: 04/19/18  3:00 PM         Result  Value  Ref Range            Color  DARK YELLOW          Appearance  CLOUDY          Specific gravity  >1.030 (H)  1.005 - 1.030       pH (UA)  5.5  5.0 - 8.0         Protein  100 (A)  NEG mg/dL       Glucose  NEGATIVE   NEG  mg/dL       Ketone  TRACE (A)  NEG mg/dL       Bilirubin  SMALL (A)  NEG         Blood  MODERATE (A)  NEG         Urobilinogen  1.0  0.2 - 1.0 EU/dL       Nitrites  NEGATIVE   NEG         Leukocyte Esterase  TRACE (A)  NEG         URINE MICROSCOPIC ONLY          Collection Time: 04/19/18  3:00 PM         Result  Value  Ref Range            WBC  0 to 3  0 - 4 /hpf       RBC  11 to 20  0 - 5 /hpf       Epithelial cells  1+  0 - 5 /lpf       Bacteria  1+ (A)  NEG /hpf            Mucus  2+ (A)  NEG /lpf           Radiologic Studies -    Ct Abd Pelv Wo Cont      Result Date: 04/19/2018   CT ABDOMEN/PELVIS WITHOUT CONTRAST CPT CODE: 82800,34917 HISTORY: Left flank pain, hematuria. COMPARISON: None. TECHNIQUE: 5 mm helical scans were obtained of the abdomen and pelvis without oral or IV contrast. Coronal and sagittal reformation. All CT  scans are performed using dose optimization techniques as appropriate to the performed exam including the following: Automated exposure control, adjustment of mA and/or kV according to patient size, and use of iterative reconstructive technique. FINDINGS:  The visualized lung bases are clear. The right and left kidney are of normal contour. Left kidney slightly larger than the right.  Very minimal left perinephric fat stranding.  There is mild left hydronephrosis and proximal hydroureter.  There is an approximate  2.5 mm stone at the proximal left ureter.  No additional calcifications seen along the expected course of the right or left ureter.  The bladder is underdistended and not well evaluated. Evaluation of the solid and hollow visceral and the abdomen and  pelvis is slightly limited on this noncontrast CT scan.  No focal lesions are seen in the visualized portions of the liver, pancreas and spleen.  No gallstones.  No adrenal nodules or masses. There is no free intraperitoneal air, free fluid, or fluid  collections.  The small and large bowel are normal in caliber.  The appendix is not  inflamed.  No gross prostatic enlargement. No calcifications of the abdominal aorta.  No aneurysmal dilatation.  No gross abdominal or pelvic lymphadenopathy. No acute  fractures seen.       IMPRESSION: 2.5 mm stone at the proximal left ureter resulting in mild obstructive uropathy of the left kidney.              Medical Decision Making     I am the first provider for this patient.      I reviewed the vital signs, available nursing notes, past medical history, past surgical history, family history and social history.      Vital Signs-Reviewed the patient's vital signs.      Pulse Oximetry Analysis -  98% on room  air (Interpretation)      Records Reviewed: Nursing Notes and Old Medical Records  (Time of Review: 4:11 PM)      ED Course: Progress Notes, Reevaluation, and Consults:      Provider Notes (Medical Decision Making):    Differential Diagnosis: renal colic, pyelonephritis, aortic aneurysm, IBD, IBS, diverticulitis, constipation, myofascial strain/sprain      Plan:  Pt presents ambulatory in NAD, well-hydrated, non-toxic in appearance, with elevated BP and otherwise unremarkable vitals.  Work up reveals 2.34m left proximal ureteral stone  with mild obstructive uropathy.  Treated here with morphine, toradol, and zofran.  He has already taken a dose of Flomax at [facility].  Will DC home with norco, zofran, flomax.  Advised to follow-up with urology for stone and PCP for elevated BP and incidentally  noted hyperglycemia.        At this time, patient is stable and appropriate for discharge home.  Patient demonstrates understanding of current diagnoses and is in agreement with the treatment plan.  They are advised that while the likelihood of serious underlying condition is low  at this point given the evaluation performed today, we cannot fully rule it out.  They are advised to immediately return with any new symptoms or worsening of current condition.  All questions have been answered.  Patient is given educational  material  regarding their diagnoses, including danger symptoms and when to return to the ED.          Diagnosis        Clinical Impression:       1.  Kidney stone      2.  Elevated blood pressure reading         3.  Hyperglycemia            Disposition: DC Home        Follow-up Information               Follow up With  Specialties  Details  Why  Contact Info              [facility]    Call in 2 days    [place]              [place]    Call in 2 days  To establish primary care and discuss need for better control of your blood pressure  [place]   [place] [facility]  Emergency Medicine  Go to  As needed, If symptoms worsen  [address]   [place] [address]   [phone]                   Patient's Medications       Start Taking           HYDROCODONE-ACETAMINOPHEN (NORCO) 5-325 MG PER TABLET     Take 1 Tab by mouth every eight (8) hours as needed for Pain for up to 4 days. Max Daily Amount: 3 Tabs.           ONDANSETRON (ZOFRAN ODT) 4 MG DISINTEGRATING TABLET     Take 1-2 tablets every 6-8 hours as needed for nausea and vomiting.           TAMSULOSIN (FLOMAX) 0.4 MG CAPSULE     Take 1 tab by mouth daily until kidney stone passes.       Continue Taking           ATORVASTATIN (LIPITOR) 10 MG TABLET     Take  by mouth daily.           CYANOCOBALAMIN (VITAMIN B-12) 100 MCG TABLET     Take 100 mcg by mouth daily.           FEXOFENADINE (ALLEGRA) 60 MG TABLET     Take  by mouth.  LISINOPRIL (PRINIVIL, ZESTRIL) 20 MG TABLET     Take 10 mg by mouth daily.           PREDNISONE (STERAPRED DS) 10 MG DOSE PACK     Take  by mouth See Admin Instructions. See administration instruction per 68m dose pack           TAMSULOSIN (FLOMAX) 0.4 MG CAPSULE     Take 0.4 mg by mouth daily.       These Medications have changed          No  medications on file       Stop Taking          No medications on file        _______________________________      This note was dictated utilizing voice recognition software which may lead to typographical errors.  I apologize in advance if the situation occurs.  If questions arise please do not hesitate to contact  me or call our department.  AAugust Saucer PA-C

## 2018-04-19 NOTE — ED Notes (Signed)
Pt sent from [facility] for left flank and abd pain. Pt is pacing, diaphoretic, nauseated. Pt drove himself here

## 2018-04-19 NOTE — ED Notes (Signed)
Pa Alison to bedside to evaluate pt.

## 2018-04-19 NOTE — ED Notes (Signed)
 Pt called to nurse's station stating that his IV came out.  Pt standing at bedside.  IV still taped to right arm with catheter intact but pulled out.  No bleeding noted to site.

## 2018-04-19 NOTE — ED Notes (Signed)
Assumed care of pt in bed 11 from [facility] RN. Pt standing at sink with pain, nausea and diaphoresis. Pt reports IV has come out of right AC due to sweating. Site has no redness/ swelling noted. No active bleeding.

## 2018-04-19 NOTE — ED Notes (Signed)
Pt is aware of need for urine specimen & location of bathroom.  Pt states he is unsure if he can void at present.  Awaiting evaluation of provider at this time.  Pt states the pain has eased off currently.  Will monitor.

## 2018-04-19 NOTE — ED Notes (Signed)
After multiple unsuccessful attempts at reinitiating IV access PA Jill Side has changed medication route to IM for pt pain control.

## 2018-04-19 NOTE — ED Notes (Signed)
Bedside and Verbal shift change report given to Becky, RN (oncoming nurse) by Michele, RN (offgoing nurse). Report included the following information SBAR.

## 2018-04-19 NOTE — ED Notes (Signed)
Both written and verbal discharge instructions given to pt with verbalized understanding of home care and follow up. Prescription given.

## 2018-04-19 NOTE — ED Notes (Signed)
Pt back from CT scan awaiting results. Reports feeling comfortable for now. Call bell given. Watching tv. Denies needs at this time.

## 2018-04-19 NOTE — ED Provider Notes (Signed)
EMERGENCY DEPARTMENT HISTORY AND PHYSICAL EXAM    4:11 PM      Date: 04/19/2018  Patient Name: Dustin Neal    History of Presenting Illness     Chief Complaint   Patient presents with   ??? Abdominal Pain       History Provided By: Patient    Chief Complaint: left flank pain, nausea  Duration:  Hours  Timing:  Acute  Location:   Quality: Stabbing  Severity: 8 out of 10  Modifying Factors: none  Associated Symptoms: denies any other associated signs or symptoms      Additional History (Context):Dustin Neal is a 23 y.o. male with a pertinent history of morbid obesity, pre-diabetes, hypertension, asthma who presents to the emergency department for evaluation of severe left-sided flank pain that began this morning.  Pain radiates from the left mid back to the left flank.  Patient admits to dark urine.  No history of kidney stones.  Patient was evaluated at urgent care just prior to arrival and told to present to the ED for evaluation for possible kidney stone.  Patient admits to nausea without vomiting.  No associated diarrhea, abdominal pain, URI symptoms, dysuria, or any other complaints at this time.      PCP:  None      Current Outpatient Medications   Medication Sig Dispense Refill   ??? tamsulosin (FLOMAX) 0.4 mg capsule Take 0.4 mg by mouth daily.     ??? lisinopril (PRINIVIL, ZESTRIL) 20 mg tablet Take 10 mg by mouth daily.     ??? atorvastatin (LIPITOR) 10 mg tablet Take  by mouth daily.     ??? fexofenadine (ALLEGRA) 60 mg tablet Take  by mouth.     ??? predniSONE (STERAPRED DS) 10 mg dose pack Take  by mouth See Admin Instructions. See administration instruction per 34m dose pack     ??? cyanocobalamin (VITAMIN B-12) 100 mcg tablet Take 100 mcg by mouth daily.     ??? HYDROcodone-acetaminophen (NORCO) 5-325 mg per tablet Take 1 Tab by mouth every eight (8) hours as needed for Pain for up to 4 days. Max Daily Amount: 3 Tabs. 12 Tab 0   ??? ondansetron (ZOFRAN ODT) 4 mg disintegrating tablet Take 1-2 tablets  every 6-8 hours as needed for nausea and vomiting. 10 Tab 0   ??? tamsulosin (FLOMAX) 0.4 mg capsule Take 1 tab by mouth daily until kidney stone passes. 10 Cap 0       Past History     Past Medical History:  Past Medical History:   Diagnosis Date   ??? Asthma    ??? Hypertension    ??? Morbid (severe) obesity due to excess calories ([facility]        Past Surgical History:  History reviewed. No pertinent surgical history.    Family History:  History reviewed. No pertinent family history.    Social History:  Social History     Tobacco Use   ??? Smoking status: Never Smoker   ??? Smokeless tobacco: Former UChief Strategy OfficerUse Topics   ??? Alcohol use: Yes     Comment: social   ??? Drug use: Not Currently       Allergies:  No Known Allergies      Review of Systems     Review of Systems   Constitutional: Negative for chills and fever.   HENT: Negative for congestion, rhinorrhea and sore throat.    Respiratory: Negative for cough and shortness of breath.  Cardiovascular: Negative for chest pain.   Gastrointestinal: Positive for nausea. Negative for abdominal pain, blood in stool, constipation, diarrhea and vomiting.   Genitourinary: Positive for flank pain and hematuria. Negative for dysuria and frequency.   Musculoskeletal: Negative for back pain and myalgias.   Skin: Negative for rash and wound.   Neurological: Negative for dizziness and headaches.   All other systems reviewed and are negative.      Physical Exam     Visit Vitals  BP (!) 151/109 (BP 1 Location: Left arm)   Pulse (!) 101   Temp 98.3 ??F (36.8 ??C)   Resp 20   Ht 6' (1.829 m)   Wt (!) 195 kg (430 lb)   SpO2 98%   BMI 58.32 kg/m??       Physical Exam  Vitals signs and nursing note reviewed.   Constitutional:       General: He is in acute distress.      Appearance: He is well-developed. He is diaphoretic.      Comments: Pacing, grimacing, diaphoretic, bent over counter   HENT:      Head: Normocephalic and atraumatic.   Eyes:      Conjunctiva/sclera: Conjunctivae normal.    Neck:      Musculoskeletal: Normal range of motion and neck supple.   Cardiovascular:      Rate and Rhythm: Normal rate and regular rhythm.      Heart sounds: Normal heart sounds.   Pulmonary:      Effort: Pulmonary effort is normal. No respiratory distress.      Breath sounds: Normal breath sounds.   Chest:      Chest wall: No tenderness.   Abdominal:      General: Bowel sounds are normal. There is no distension.      Palpations: Abdomen is soft.      Tenderness: There is no tenderness. There is no guarding or rebound.   Musculoskeletal: Normal range of motion.         General: No deformity.   Skin:     General: Skin is warm.   Neurological:      Mental Status: He is alert and oriented to person, place, and time.       Diagnostic Study Results     Labs -  Recent Results (from the past 12 hour(s))   METABOLIC PANEL, COMPREHENSIVE    Collection Time: 04/19/18  2:35 PM   Result Value Ref Range    Sodium 139 136 - 145 mmol/L    Potassium 3.9 3.5 - 5.5 mmol/L    Chloride 106 100 - 111 mmol/L    CO2 23 21 - 32 mmol/L    Anion gap 10 3.0 - 18 mmol/L    Glucose 182 (H) 74 - 99 mg/dL    BUN 18 7.0 - 18 MG/DL    Creatinine 1.01 0.6 - 1.3 MG/DL    BUN/Creatinine ratio 18 12 - 20      GFR est AA >60 >60 ml/min/1.79m    GFR est non-AA >60 >60 ml/min/1.796m   Calcium 9.5 8.5 - 10.1 MG/DL    Bilirubin, total 0.5 0.2 - 1.0 MG/DL    ALT (SGPT) 68 (H) 16 - 61 U/L    AST (SGOT) 19 10 - 38 U/L    Alk. phosphatase 74 45 - 117 U/L    Protein, total 7.5 6.4 - 8.2 g/dL    Albumin 4.0 3.4 - 5.0 g/dL    Globulin 3.5  2.0 - 4.0 g/dL    A-G Ratio 1.1 0.8 - 1.7     CBC WITH AUTOMATED DIFF    Collection Time: 04/19/18  2:37 PM   Result Value Ref Range    WBC 16.6 (H) 4.6 - 13.2 K/uL    RBC 4.90 4.70 - 5.50 M/uL    HGB 14.6 13.0 - 16.0 g/dL    HCT 44.1 36.0 - 48.0 %    MCV 90.0 74.0 - 97.0 FL    MCH 29.8 24.0 - 34.0 PG    MCHC 33.1 31.0 - 37.0 g/dL    RDW 13.6 11.6 - 14.5 %    PLATELET 267 135 - 420 K/uL    MPV 10.1 9.2 - 11.8 FL     NEUTROPHILS 77 (H) 40 - 73 %    LYMPHOCYTES 11 (L) 21 - 52 %    MONOCYTES 7 3 - 10 %    EOSINOPHILS 5 0 - 5 %    BASOPHILS 0 0 - 2 %    ABS. NEUTROPHILS 12.8 (H) 1.8 - 8.0 K/UL    ABS. LYMPHOCYTES 1.8 0.9 - 3.6 K/UL    ABS. MONOCYTES 1.1 0.05 - 1.2 K/UL    ABS. EOSINOPHILS 0.9 (H) 0.0 - 0.4 K/UL    ABS. BASOPHILS 0.0 0.0 - 0.1 K/UL    DF AUTOMATED     URINALYSIS W/ RFLX MICROSCOPIC    Collection Time: 04/19/18  3:00 PM   Result Value Ref Range    Color DARK YELLOW      Appearance CLOUDY      Specific gravity >1.030 (H) 1.005 - 1.030    pH (UA) 5.5 5.0 - 8.0      Protein 100 (A) NEG mg/dL    Glucose NEGATIVE  NEG mg/dL    Ketone TRACE (A) NEG mg/dL    Bilirubin SMALL (A) NEG      Blood MODERATE (A) NEG      Urobilinogen 1.0 0.2 - 1.0 EU/dL    Nitrites NEGATIVE  NEG      Leukocyte Esterase TRACE (A) NEG     URINE MICROSCOPIC ONLY    Collection Time: 04/19/18  3:00 PM   Result Value Ref Range    WBC 0 to 3 0 - 4 /hpf    RBC 11 to 20 0 - 5 /hpf    Epithelial cells 1+ 0 - 5 /lpf    Bacteria 1+ (A) NEG /hpf    Mucus 2+ (A) NEG /lpf       Radiologic Studies -   Ct Abd Pelv Wo Cont    Result Date: 04/19/2018  CT ABDOMEN/PELVIS WITHOUT CONTRAST CPT CODE: [id number] HISTORY: Left flank pain, hematuria. COMPARISON: None. TECHNIQUE: 5 mm helical scans were obtained of the abdomen and pelvis without oral or IV contrast. Coronal and sagittal reformation. All CT scans are performed using dose optimization techniques as appropriate to the performed exam including the following: Automated exposure control, adjustment of mA and/or kV according to patient size, and use of iterative reconstructive technique. FINDINGS: The visualized lung bases are clear. The right and left kidney are of normal contour. Left kidney slightly larger than the right.  Very minimal left perinephric fat stranding.  There is mild left hydronephrosis and proximal hydroureter.  There is an approximate 2.5 mm stone at the  proximal left ureter.  No additional calcifications seen along the expected course of the right or left ureter.  The bladder is underdistended and not well evaluated.  Evaluation of the solid and hollow visceral and the abdomen and pelvis is slightly limited on this noncontrast CT scan.  No focal lesions are seen in the visualized portions of the liver, pancreas and spleen.  No gallstones.  No adrenal nodules or masses. There is no free intraperitoneal air, free fluid, or fluid collections.  The small and large bowel are normal in caliber.  The appendix is not inflamed.  No gross prostatic enlargement. No calcifications of the abdominal aorta.  No aneurysmal dilatation.  No gross abdominal or pelvic lymphadenopathy. No acute fractures seen.     IMPRESSION: 2.5 mm stone at the proximal left ureter resulting in mild obstructive uropathy of the left kidney.        Medical Decision Making   I am the first provider for this patient.    I reviewed the vital signs, available nursing notes, past medical history, past surgical history, family history and social history.    Vital Signs-Reviewed the patient's vital signs.    Pulse Oximetry Analysis -  98% on room air (Interpretation)    Records Reviewed: Nursing Notes and Old Medical Records (Time of Review: 4:11 PM)    ED Course: Progress Notes, Reevaluation, and Consults:    Provider Notes (Medical Decision Making):   Differential Diagnosis: renal colic, pyelonephritis, aortic aneurysm, IBD, IBS, diverticulitis, constipation, myofascial strain/sprain    Plan:  Pt presents ambulatory in NAD, well-hydrated, non-toxic in appearance, with elevated BP and otherwise unremarkable vitals.  Work up reveals 2.88m left proximal ureteral stone with mild obstructive uropathy.  Treated here with morphine, toradol, and zofran.  [facility]  Will DC home with norco, zofran, flomax.  Advised to follow-up with urology for stone and PCP for elevated BP and  incidentally noted hyperglycemia.      At this time, patient is stable and appropriate for discharge home.  Patient demonstrates understanding of current diagnoses and is in agreement with the treatment plan.  They are advised that while the likelihood of serious underlying condition is low at this point given the evaluation performed today, we cannot fully rule it out.  They are advised to immediately return with any new symptoms or worsening of current condition.  All questions have been answered.  Patient is given educational material regarding their diagnoses, including danger symptoms and when to return to the ED.      Diagnosis     Clinical Impression:   1. Kidney stone    2. Elevated blood pressure reading    3. Hyperglycemia        Disposition: DC Home    Follow-up Information     Follow up With Specialties Details Why Contact Info    [facility] [place] Call in [facility] days  [place]   [place] Call in 2 days To establish primary care and discuss need for better control of your blood pressure [id number] Emergency Medicine Go to As needed, If symptoms worsen [address]  [place] [id number] [phone]          Patient's Medications   Start Taking    HYDROCODONE-ACETAMINOPHEN (NORCO) 5-325 MG PER TABLET    Take 1 Tab by mouth every eight (8) hours as needed for Pain for up to 4 days. Max  Daily Amount: 3 Tabs.    ONDANSETRON (ZOFRAN ODT) 4 MG DISINTEGRATING TABLET    Take 1-2 tablets every 6-8 hours as needed for nausea and vomiting.    TAMSULOSIN (FLOMAX) 0.4 MG CAPSULE    Take 1 tab by mouth daily until kidney stone passes.   Continue Taking    ATORVASTATIN (LIPITOR) 10 MG TABLET    Take  by mouth daily.    CYANOCOBALAMIN (VITAMIN B-12) 100 MCG TABLET    Take 100 mcg by mouth daily.     FEXOFENADINE (ALLEGRA) 60 MG TABLET    Take  by mouth.    LISINOPRIL (PRINIVIL, ZESTRIL) 20 MG TABLET    Take 10 mg by mouth daily.    PREDNISONE (STERAPRED DS) 10 MG DOSE PACK    Take  by mouth See Admin Instructions. See administration instruction per 36m dose pack    TAMSULOSIN (FLOMAX) 0.4 MG CAPSULE    Take 0.4 mg by mouth daily.   These Medications have changed    No medications on file   Stop Taking    No medications on file     _______________________________    This note was dictated utilizing voice recognition software which may lead to typographical errors.  I apologize in advance if the situation occurs.  If questions arise please do not hesitate to contact me or call our department.  AAugust Saucer PA-C

## 2018-04-19 NOTE — ED Notes (Signed)
Pt called to nurse's station stating that his IV "came out".  Pt standing at bedside.  IV still taped to right arm with catheter intact but pulled out.  No bleeding noted to site.

## 2018-04-19 NOTE — ED Notes (Signed)
Pt is aware of need for urine specimen & location of bathroom.  Pt states he is unsure if he can void at present.  Awaiting evaluation of provider at this time.  Pt states the pain has eased off currently.  Will monitor.

## 2018-04-19 NOTE — ED Triage Notes (Signed)
Pt sent from [facility] for left flank and abd pain. Pt is pacing, diaphoretic, nauseated. Pt drove himself here

## 2018-04-19 NOTE — ED Notes (Signed)
Assumed care of pt in bed 11 from Michele Temme RN. Pt standing at sink with pain, nausea and diaphoresis. Pt reports IV has come out of right AC due to sweating. Site has no redness/ swelling noted. No active bleeding.

## 2018-04-19 NOTE — ED Notes (Signed)
After multiple unsuccessful attempts at reinitiating IV access PA Alison has changed medication route to IM for pt pain control.

## 2018-04-19 NOTE — ED Notes (Signed)
Pt back from CT scan awaiting results. Reports feeling comfortable for now. Call bell given. Watching tv. Denies needs at this time.

## 2021-09-13 ENCOUNTER — Encounter (HOSPITAL_COMMUNITY): Payer: Self-pay | Admitting: Emergency Medicine

## 2021-09-13 ENCOUNTER — Other Ambulatory Visit: Payer: Self-pay

## 2021-09-13 ENCOUNTER — Ambulatory Visit (HOSPITAL_COMMUNITY)
Admission: EM | Admit: 2021-09-13 | Discharge: 2021-09-14 | Disposition: A | Discharge status: Home / Self Care | Payer: BC Managed Care – PPO | Attending: Emergency Medicine | Admitting: Emergency Medicine

## 2021-09-13 ENCOUNTER — Emergency Department (HOSPITAL_COMMUNITY): Payer: BC Managed Care – PPO

## 2021-09-13 DIAGNOSIS — E876 Hypokalemia: Secondary | ICD-10-CM | POA: Insufficient documentation

## 2021-09-13 DIAGNOSIS — R1010 Upper abdominal pain, unspecified: Secondary | ICD-10-CM | POA: Diagnosis not present

## 2021-09-13 DIAGNOSIS — I10 Essential (primary) hypertension: Secondary | ICD-10-CM | POA: Insufficient documentation

## 2021-09-13 DIAGNOSIS — K760 Fatty (change of) liver, not elsewhere classified: Secondary | ICD-10-CM | POA: Insufficient documentation

## 2021-09-13 DIAGNOSIS — J45909 Unspecified asthma, uncomplicated: Secondary | ICD-10-CM | POA: Diagnosis not present

## 2021-09-13 DIAGNOSIS — Z7984 Long term (current) use of oral hypoglycemic drugs: Secondary | ICD-10-CM | POA: Diagnosis not present

## 2021-09-13 DIAGNOSIS — K219 Gastro-esophageal reflux disease without esophagitis: Secondary | ICD-10-CM | POA: Diagnosis not present

## 2021-09-13 DIAGNOSIS — R112 Nausea with vomiting, unspecified: Secondary | ICD-10-CM | POA: Diagnosis not present

## 2021-09-13 DIAGNOSIS — K3580 Unspecified acute appendicitis: Secondary | ICD-10-CM | POA: Diagnosis not present

## 2021-09-13 DIAGNOSIS — F419 Anxiety disorder, unspecified: Secondary | ICD-10-CM | POA: Diagnosis not present

## 2021-09-13 DIAGNOSIS — E119 Type 2 diabetes mellitus without complications: Secondary | ICD-10-CM | POA: Diagnosis not present

## 2021-09-13 HISTORY — DX: Type 2 diabetes mellitus without complications: E11.9

## 2021-09-13 HISTORY — DX: Anxiety disorder, unspecified: F41.9

## 2021-09-13 MED ORDER — ONDANSETRON HCL 4 MG/2ML IJ SOLN
4.0000 mg | Freq: Once | INTRAMUSCULAR | Status: AC
  Administered 2021-09-13: 4 mg via INTRAVENOUS
  Filled 2021-09-13: qty 2

## 2021-09-13 MED ORDER — ONDANSETRON HCL 4 MG/2ML IJ SOLN
4.0000 mg | Freq: Once | INTRAMUSCULAR | Status: AC | PRN
  Administered 2021-09-14: 4 mg via INTRAVENOUS
  Filled 2021-09-13: qty 2

## 2021-09-13 MED ORDER — FENTANYL CITRATE PF 50 MCG/ML IJ SOSY
100.0000 ug | PREFILLED_SYRINGE | Freq: Once | INTRAMUSCULAR | Status: AC
  Administered 2021-09-13: 100 ug via INTRAVENOUS
  Filled 2021-09-13: qty 2

## 2021-09-13 NOTE — ED Notes (Signed)
Attempted IV access without success.

## 2021-09-13 NOTE — ED Provider Notes (Signed)
[facility] EMERGENCY DEPARTMENT Provider Note   CSN: [id number] Arrival date & time: 09/13/21  2106     History  Chief Complaint  Patient presents with   Abdominal Pain    Mark Small is a 25 y.o. male.  HPI He reports onset of vomiting and upper abdominal pain today.  He has vomited numerous times.  He does not think he is constipated but feels like he has to have a bowel movement, his last bowel movement was 2 days ago.  He is taking his usual medications as prescribed.  He has diabetes and high blood pressure.  He is here with a family member    Home Medications Prior to Admission medications   Medication Sig Start Date End Date Taking? Authorizing Provider  albuterol (PROVENTIL HFA;VENTOLIN HFA) 108 (90 Base) MCG/ACT inhaler Inhale into the lungs every 4 (four) hours as needed for wheezing or shortness of breath.    [provider]  atorvastatin (LIPITOR) 20 MG tablet Take 20 mg by mouth daily. 09/03/17   [provider]  azithromycin (ZITHROMAX) 250 MG tablet Take 250 mg by mouth 2 (two) times daily. For three days only; last dose 09/25/17    [provider]  Cholecalciferol (VITAMIN D) 2000 units CAPS Take 2,000 Units by mouth daily.    [provider]  Cyanocobalamin (VITAMIN B-12 CR) 1000 MCG TBCR Take 1,000 mcg by mouth daily. 08/30/17   [provider]  fexofenadine (ALLEGRA) 180 MG tablet Take 180 mg by mouth daily.      [provider]  fluticasone (FLONASE) 50 MCG/ACT nasal spray Place 2 sprays into both nostrils daily.    [provider]  ketotifen (ZADITOR) 0.025 % ophthalmic solution Place 1 drop into both eyes daily as needed (allergies).    [provider]  lisinopril (PRINIVIL,ZESTRIL) 20 MG tablet Take 20 mg by mouth daily. 09/03/17   [provider]  metFORMIN (GLUCOPHAGE XR) 500 MG 24 hr tablet Take 2 tablets (1,000 mg total) by mouth 2 (two) times daily with a meal. Patient not  taking: Reported on 09/20/2017 03/22/11 03/21/12  Lelon Huh, MD      Allergies    Patient has no known allergies.    Review of Systems   Review of Systems  Physical Exam Updated Vital Signs BP (!) 149/94 (BP Location: Right Wrist)   Pulse 92   Temp 97.7 F (36.5 C) (Oral)   Resp 20   Ht 6' (1.829 m)   Wt (!) 174.6 kg   SpO2 100%   BMI 52.22 kg/m  Physical Exam Vitals and nursing note reviewed.  Constitutional:      General: He is in acute distress (He is uncomfortable).     Appearance: He is well-developed. He is obese. He is not ill-appearing.  HENT:     Head: Normocephalic and atraumatic.     Right Ear: External ear normal.     Left Ear: External ear normal.     Mouth/Throat:     Mouth: Mucous membranes are moist.  Eyes:     Conjunctiva/sclera: Conjunctivae normal.     Pupils: Pupils are equal, round, and reactive to light.  Neck:     Trachea: Phonation normal.  Cardiovascular:     Rate and Rhythm: Normal rate and regular rhythm.     Heart sounds: Normal heart sounds.  Pulmonary:     Effort: Pulmonary effort is normal. No respiratory distress.     Breath sounds: Normal breath  sounds. No stridor.  Abdominal:     General: There is no distension.     Palpations: Abdomen is soft.     Tenderness: There is no abdominal tenderness.  Musculoskeletal:        General: Normal range of motion.     Cervical back: Normal range of motion and neck supple.  Skin:    General: Skin is warm and dry.  Neurological:     Mental Status: He is alert and oriented to person, place, and time.     Cranial Nerves: No cranial nerve deficit.     Sensory: No sensory deficit.     Motor: No abnormal muscle tone.     Coordination: Coordination normal.  Psychiatric:        Mood and Affect: Mood normal.        Behavior: Behavior normal.        Thought Content: Thought content normal.        Judgment: Judgment normal.     ED Results / Procedures / Treatments   Labs (all labs  ordered are listed, but only abnormal results are displayed) Labs Reviewed  COMPREHENSIVE METABOLIC PANEL - Abnormal; Notable for the following components:      Result Value   Potassium 2.7 (*)    Glucose, Bld 153 (*)    Total Protein 8.5 (*)    ALT 47 (*)    Total Bilirubin 1.4 (*)    All other components within normal limits  CBC - Abnormal; Notable for the following components:   WBC 19.6 (*)    All other components within normal limits  LIPASE, BLOOD  URINALYSIS, ROUTINE W REFLEX MICROSCOPIC    EKG None  Radiology No results found.  Procedures Procedures    Medications Ordered in ED Medications  ondansetron (ZOFRAN) injection 4 mg (has no administration in time range)  potassium chloride 10 mEq in 100 mL IVPB (has no administration in time range)  fentaNYL (SUBLIMAZE) injection 100 mcg (100 mcg Intravenous Given 09/13/21 2357)  ondansetron (ZOFRAN) injection 4 mg (4 mg Intravenous Given 09/13/21 2357)    ED Course/ Medical Decision Making/ A&P Clinical Course as of 09/14/21 1236  Thu Sep 14, 2021  0051 CT Abdomen Pelvis W Contrast [EW]    Clinical Course User Index [EW] Daleen Bo, MD                           Medical Decision Making Patient presenting with acute abdominal pain with vomiting.  Consideration for acute illness including appendicitis, colitis, bowel obstruction, constipation and urinary tract infection.  He requires ED evaluation for these problems.  Problems Addressed: Hypokalemia: acute illness or injury    Details: Likely secondary to vomiting Nausea and vomiting, unspecified vomiting type: acute illness or injury    Details: Unclear cause requires CT imaging Pain of upper abdomen: acute illness or injury    Details: Associated with vomiting  Amount and/or Complexity of Data Reviewed Labs: ordered. Radiology: ordered. Decision-making details documented in ED Course.    Details: CT scan ordered, not resulted at the time of transfer of  care.  Risk Prescription drug management. Risk Details: Patient presenting with undefined illness requiring evaluation.  Initial blood work indicated hypokalemia.  This was supplemented with IV potassium.  Given IV fluids, and antiemetic to treat vomiting.  CT imaging ordered and pending.  Disposition by oncoming team following completion of testing.  Critical Care Total time providing critical care:  35 minutes           Final Clinical Impression(s) / ED Diagnoses Final diagnoses:  Nausea and vomiting, unspecified vomiting type  Pain of upper abdomen  Hypokalemia    Rx / DC Orders ED Discharge Orders     None         Daleen Bo, MD 09/14/21 1240

## 2021-09-13 NOTE — ED Triage Notes (Signed)
Pt c/o abd pain with n/v/body aches/chills x 2 days, worsening this am; denies diarrhea/fever

## 2021-09-14 ENCOUNTER — Other Ambulatory Visit: Payer: Self-pay

## 2021-09-14 ENCOUNTER — Emergency Department (HOSPITAL_COMMUNITY): Payer: BC Managed Care – PPO | Admitting: Certified Registered"

## 2021-09-14 ENCOUNTER — Encounter (HOSPITAL_COMMUNITY): Payer: Self-pay | Admitting: Certified Registered"

## 2021-09-14 ENCOUNTER — Encounter (HOSPITAL_COMMUNITY)
Admission: EM | Disposition: A | Discharge status: Home / Self Care | Payer: Self-pay | Source: Home / Self Care | Attending: Emergency Medicine

## 2021-09-14 DIAGNOSIS — K353 Acute appendicitis with localized peritonitis, without perforation or gangrene: Secondary | ICD-10-CM

## 2021-09-14 DIAGNOSIS — K3580 Unspecified acute appendicitis: Secondary | ICD-10-CM

## 2021-09-14 HISTORY — PX: LAPAROSCOPIC APPENDECTOMY: SHX408

## 2021-09-14 LAB — URINALYSIS, ROUTINE W REFLEX MICROSCOPIC
Bacteria, UA: NONE SEEN
Bilirubin Urine: NEGATIVE
Glucose, UA: NEGATIVE mg/dL
Ketones, ur: 5 mg/dL — AB
Leukocytes,Ua: NEGATIVE
Nitrite: NEGATIVE
Protein, ur: 100 mg/dL — AB
Specific Gravity, Urine: 1.021 (ref 1.005–1.030)
pH: 6 (ref 5.0–8.0)

## 2021-09-14 LAB — COMPREHENSIVE METABOLIC PANEL
ALT: 47 U/L — ABNORMAL HIGH (ref 0–44)
AST: 24 U/L (ref 15–41)
Albumin: 4.8 g/dL (ref 3.5–5.0)
Alkaline Phosphatase: 54 U/L (ref 38–126)
Anion gap: 12 (ref 5–15)
BUN: 16 mg/dL (ref 6–20)
CO2: 25 mmol/L (ref 22–32)
Calcium: 10.1 mg/dL (ref 8.9–10.3)
Chloride: 99 mmol/L (ref 98–111)
Creatinine, Ser: 0.95 mg/dL (ref 0.61–1.24)
GFR, Estimated: >60 mL/min (ref >60)
Glucose, Bld: 153 mg/dL — ABNORMAL HIGH (ref 70–99)
Potassium: 2.7 mmol/L — CL (ref 3.5–5.1)
Sodium: 136 mmol/L (ref 135–145)
Total Bilirubin: 1.4 mg/dL — ABNORMAL HIGH (ref 0.3–1.2)
Total Protein: 8.5 g/dL — ABNORMAL HIGH (ref 6.5–8.1)

## 2021-09-14 LAB — CBC
HCT: 45 % (ref 39.0–52.0)
Hemoglobin: 15.5 g/dL (ref 13.0–17.0)
MCH: 29.6 pg (ref 26.0–34.0)
MCHC: 34.4 g/dL (ref 30.0–36.0)
MCV: 85.9 fL (ref 80.0–100.0)
Platelets: 371 10*3/uL (ref 150–400)
RBC: 5.24 MIL/uL (ref 4.22–5.81)
RDW: 12.9 % (ref 11.5–15.5)
WBC: 19.6 10*3/uL — ABNORMAL HIGH (ref 4.0–10.5)
nRBC: 0 % (ref 0.0–0.2)

## 2021-09-14 LAB — GLUCOSE, CAPILLARY
Glucose-Capillary: 140 mg/dL — ABNORMAL HIGH (ref 70–99)
Glucose-Capillary: 160 mg/dL — ABNORMAL HIGH (ref 70–99)

## 2021-09-14 LAB — LIPASE, BLOOD: Lipase: 22 U/L (ref 11–51)

## 2021-09-14 SURGERY — APPENDECTOMY, LAPAROSCOPIC
Anesthesia: General | Site: Abdomen

## 2021-09-14 MED ORDER — MIDAZOLAM HCL 5 MG/5ML IJ SOLN
INTRAMUSCULAR | Status: DC | PRN
  Administered 2021-09-14: 2 mg via INTRAVENOUS

## 2021-09-14 MED ORDER — DEXMEDETOMIDINE HCL IN NACL 80 MCG/20ML IV SOLN
INTRAVENOUS | Status: AC
  Filled 2021-09-14: qty 20

## 2021-09-14 MED ORDER — MORPHINE SULFATE (PF) 4 MG/ML IV SOLN
4.0000 mg | INTRAVENOUS | Status: DC | PRN
  Administered 2021-09-14: 4 mg via INTRAVENOUS
  Filled 2021-09-14: qty 1

## 2021-09-14 MED ORDER — ONDANSETRON HCL 4 MG/2ML IJ SOLN
INTRAMUSCULAR | Status: AC
  Administered 2021-09-14: 4 mg via INTRAVENOUS
  Filled 2021-09-14: qty 2

## 2021-09-14 MED ORDER — SODIUM CHLORIDE 0.9 % IV SOLN
2.0000 g | INTRAVENOUS | Status: AC
  Administered 2021-09-14: 2 g via INTRAVENOUS

## 2021-09-14 MED ORDER — ONDANSETRON HCL 4 MG/2ML IJ SOLN
INTRAMUSCULAR | Status: AC
  Filled 2021-09-14: qty 2

## 2021-09-14 MED ORDER — LIDOCAINE HCL (PF) 2 % IJ SOLN
INTRAMUSCULAR | Status: AC
  Filled 2021-09-14: qty 5

## 2021-09-14 MED ORDER — ONDANSETRON HCL 4 MG PO TABS: 4.0000 mg | ORAL_TABLET | Freq: Three times a day (TID) | ORAL | 1 refills | Status: DC | PRN

## 2021-09-14 MED ORDER — METOCLOPRAMIDE HCL 5 MG/ML IJ SOLN: 10.0000 mg | Freq: Once | INTRAMUSCULAR | Status: AC

## 2021-09-14 MED ORDER — FENTANYL CITRATE PF 50 MCG/ML IJ SOSY
25.0000 ug | PREFILLED_SYRINGE | INTRAMUSCULAR | Status: DC | PRN
  Administered 2021-09-14 (×2): 50 ug via INTRAVENOUS
  Filled 2021-09-14 (×2): qty 1

## 2021-09-14 MED ORDER — SUGAMMADEX SODIUM 200 MG/2ML IV SOLN
INTRAVENOUS | Status: DC | PRN
  Administered 2021-09-14: 400 mg via INTRAVENOUS

## 2021-09-14 MED ORDER — POTASSIUM CHLORIDE 10 MEQ/100ML IV SOLN
10.0000 meq | INTRAVENOUS | Status: AC
  Administered 2021-09-14 (×3): 10 meq via INTRAVENOUS
  Filled 2021-09-14 (×3): qty 100

## 2021-09-14 MED ORDER — OXYCODONE HCL 5 MG PO TABS: 5.0000 mg | ORAL_TABLET | ORAL | 0 refills | Status: AC | PRN

## 2021-09-14 MED ORDER — ROCURONIUM BROMIDE 10 MG/ML (PF) SYRINGE
PREFILLED_SYRINGE | INTRAVENOUS | Status: DC | PRN
  Administered 2021-09-14: 70 mg via INTRAVENOUS
  Administered 2021-09-14: 10 mg via INTRAVENOUS

## 2021-09-14 MED ORDER — SUCCINYLCHOLINE CHLORIDE 200 MG/10ML IV SOSY
PREFILLED_SYRINGE | INTRAVENOUS | Status: DC | PRN
  Administered 2021-09-14: 160 mg via INTRAVENOUS

## 2021-09-14 MED ORDER — LACTATED RINGERS IV SOLN: INTRAVENOUS | Status: DC | PRN

## 2021-09-14 MED ORDER — MIDAZOLAM HCL 2 MG/2ML IJ SOLN
INTRAMUSCULAR | Status: AC
  Filled 2021-09-14: qty 2

## 2021-09-14 MED ORDER — POTASSIUM CHLORIDE 10 MEQ/100ML IV SOLN
10.0000 meq | INTRAVENOUS | Status: DC
Start: 2021-09-14 — End: 2021-09-14

## 2021-09-14 MED ORDER — CHLORHEXIDINE GLUCONATE CLOTH 2 % EX PADS: 6.0000 | MEDICATED_PAD | Freq: Once | CUTANEOUS | Status: DC

## 2021-09-14 MED ORDER — SODIUM CHLORIDE 0.9 % IR SOLN
Status: DC | PRN
  Administered 2021-09-14: 1000 mL

## 2021-09-14 MED ORDER — BUPIVACAINE LIPOSOME 1.3 % IJ SUSP
INTRAMUSCULAR | Status: DC | PRN
  Administered 2021-09-14: 20 mL

## 2021-09-14 MED ORDER — PHENYLEPHRINE 80 MCG/ML (10ML) SYRINGE FOR IV PUSH (FOR BLOOD PRESSURE SUPPORT)
PREFILLED_SYRINGE | INTRAVENOUS | Status: DC | PRN
  Administered 2021-09-14: 240 ug via INTRAVENOUS

## 2021-09-14 MED ORDER — DEXAMETHASONE SODIUM PHOSPHATE 10 MG/ML IJ SOLN
INTRAMUSCULAR | Status: AC
  Filled 2021-09-14: qty 1

## 2021-09-14 MED ORDER — FENTANYL CITRATE (PF) 250 MCG/5ML IJ SOLN
INTRAMUSCULAR | Status: AC
  Filled 2021-09-14: qty 5

## 2021-09-14 MED ORDER — DEXAMETHASONE SODIUM PHOSPHATE 10 MG/ML IJ SOLN
INTRAMUSCULAR | Status: DC | PRN
  Administered 2021-09-14: 5 mg via INTRAVENOUS

## 2021-09-14 MED ORDER — PROPOFOL 10 MG/ML IV BOLUS
INTRAVENOUS | Status: AC
  Filled 2021-09-14: qty 20

## 2021-09-14 MED ORDER — METOCLOPRAMIDE HCL 5 MG/ML IJ SOLN
INTRAMUSCULAR | Status: AC
  Administered 2021-09-14: 10 mg via INTRAVENOUS
  Filled 2021-09-14: qty 2

## 2021-09-14 MED ORDER — ONDANSETRON HCL 4 MG/2ML IJ SOLN: 4.0000 mg | Freq: Once | INTRAMUSCULAR | Status: DC | PRN

## 2021-09-14 MED ORDER — SUCCINYLCHOLINE CHLORIDE 200 MG/10ML IV SOSY
PREFILLED_SYRINGE | INTRAVENOUS | Status: AC
  Filled 2021-09-14: qty 10

## 2021-09-14 MED ORDER — SODIUM CHLORIDE 0.9 % IV SOLN
2.0000 g | Freq: Once | INTRAVENOUS | Status: AC
  Administered 2021-09-14: 2 g via INTRAVENOUS
  Filled 2021-09-14: qty 20

## 2021-09-14 MED ORDER — DEXMEDETOMIDINE HCL IN NACL 200 MCG/50ML IV SOLN
INTRAVENOUS | Status: DC | PRN
  Administered 2021-09-14 (×3): 8 ug via INTRAVENOUS

## 2021-09-14 MED ORDER — SUGAMMADEX SODIUM 500 MG/5ML IV SOLN
INTRAVENOUS | Status: AC
  Filled 2021-09-14: qty 5

## 2021-09-14 MED ORDER — ONDANSETRON HCL 4 MG/2ML IJ SOLN
4.0000 mg | Freq: Once | INTRAMUSCULAR | Status: AC
Start: 2021-09-14 — End: 2021-09-14

## 2021-09-14 MED ORDER — FENTANYL CITRATE (PF) 250 MCG/5ML IJ SOLN
INTRAMUSCULAR | Status: DC | PRN
Start: 2021-09-14 — End: 2021-09-14
  Administered 2021-09-14: 100 ug via INTRAVENOUS
  Administered 2021-09-14 (×3): 50 ug via INTRAVENOUS

## 2021-09-14 MED ORDER — ONDANSETRON HCL 4 MG/2ML IJ SOLN
INTRAMUSCULAR | Status: DC | PRN
  Administered 2021-09-14: 4 mg via INTRAVENOUS

## 2021-09-14 MED ORDER — IOHEXOL 300 MG/ML  SOLN
100.0000 mL | Freq: Once | INTRAMUSCULAR | Status: AC | PRN
  Administered 2021-09-14: 100 mL via INTRAVENOUS

## 2021-09-14 MED ORDER — LIDOCAINE 2% (20 MG/ML) 5 ML SYRINGE
INTRAMUSCULAR | Status: DC | PRN
  Administered 2021-09-14: 100 mg via INTRAVENOUS

## 2021-09-14 MED ORDER — ROCURONIUM BROMIDE 10 MG/ML (PF) SYRINGE
PREFILLED_SYRINGE | INTRAVENOUS | Status: AC
  Filled 2021-09-14: qty 10

## 2021-09-14 MED ORDER — PROPOFOL 10 MG/ML IV BOLUS
INTRAVENOUS | Status: DC | PRN
  Administered 2021-09-14: 250 mg via INTRAVENOUS

## 2021-09-14 SURGICAL SUPPLY — 49 items
BAG RETRIEVAL 10 (BASKET) ×1
BLADE SURG 15 STRL LF DISP TIS (BLADE) ×1 IMPLANT
BLADE SURG 15 STRL SS (BLADE) ×2
CHLORAPREP W/TINT 26 (MISCELLANEOUS) ×2 IMPLANT
CLOTH BEACON ORANGE TIMEOUT ST (SAFETY) ×2 IMPLANT
COVER LIGHT HANDLE STERIS (MISCELLANEOUS) ×4 IMPLANT
CUTTER FLEX LINEAR 45M (STAPLE) ×2 IMPLANT
DERMABOND ADVANCED (GAUZE/BANDAGES/DRESSINGS) ×1
DERMABOND ADVANCED .7 DNX12 (GAUZE/BANDAGES/DRESSINGS) ×1 IMPLANT
ELECT REM PT RETURN 9FT ADLT (ELECTROSURGICAL) ×2
ELECTRODE REM PT RTRN 9FT ADLT (ELECTROSURGICAL) ×1 IMPLANT
GAUZE 4X4 16PLY ~~LOC~~+RFID DBL (SPONGE) ×1 IMPLANT
GLOVE BIO SURGEON STRL SZ 6.5 (GLOVE) ×4 IMPLANT
GLOVE BIOGEL PI IND STRL 6.5 (GLOVE) ×1 IMPLANT
GLOVE BIOGEL PI IND STRL 7.0 (GLOVE) ×2 IMPLANT
GLOVE BIOGEL PI INDICATOR 6.5 (GLOVE) ×1
GLOVE BIOGEL PI INDICATOR 7.0 (GLOVE) ×2
GLOVE SS BIOGEL STRL SZ 6.5 (GLOVE) IMPLANT
GLOVE SUPERSENSE BIOGEL SZ 6.5 (GLOVE) ×1
GOWN STRL REUS W/TWL LRG LVL3 (GOWN DISPOSABLE) ×4 IMPLANT
GRASPER SUT TROCAR 14GX15 (MISCELLANEOUS) ×1 IMPLANT
INST SET LAPROSCOPIC AP (KITS) ×2 IMPLANT
KIT TURNOVER KIT A (KITS) ×2 IMPLANT
MANIFOLD NEPTUNE II (INSTRUMENTS) ×2 IMPLANT
NDL HYPO 21X1.5 SAFETY (NEEDLE) ×1 IMPLANT
NDL INSUFFLATION 14GA 120MM (NEEDLE) ×1 IMPLANT
NEEDLE HYPO 21X1.5 SAFETY (NEEDLE) ×2 IMPLANT
NEEDLE INSUFFLATION 14GA 120MM (NEEDLE) ×2 IMPLANT
NS IRRIG 1000ML POUR BTL (IV SOLUTION) ×2 IMPLANT
PACK LAP CHOLE LZT030E (CUSTOM PROCEDURE TRAY) ×2 IMPLANT
PAD ARMBOARD 7.5X6 YLW CONV (MISCELLANEOUS) ×2 IMPLANT
PENCIL HANDSWITCHING (ELECTRODE) ×1 IMPLANT
RELOAD STAPLE 45 3.5 BLU ETS (ENDOMECHANICALS) IMPLANT
RELOAD STAPLE TA45 3.5 REG BLU (ENDOMECHANICALS) ×2 IMPLANT
SET BASIN LINEN APH (SET/KITS/TRAYS/PACK) ×2 IMPLANT
SET TUBE SMOKE EVAC HIGH FLOW (TUBING) ×2 IMPLANT
SHEARS HARMONIC ACE PLUS 36CM (ENDOMECHANICALS) ×2 IMPLANT
SUT MNCRL AB 4-0 PS2 18 (SUTURE) ×4 IMPLANT
SUT VICRYL 0 UR6 27IN ABS (SUTURE) ×3 IMPLANT
SYR 20ML LL LF (SYRINGE) ×3 IMPLANT
SYS BAG RETRIEVAL 10MM (BASKET) ×1
SYSTEM BAG RETRIEVAL 10MM (BASKET) ×1 IMPLANT
TRAY FOLEY W/BAG SLVR 16FR (SET/KITS/TRAYS/PACK) ×2
TRAY FOLEY W/BAG SLVR 16FR ST (SET/KITS/TRAYS/PACK) ×1 IMPLANT
TROCAR Z-THAD FIOS HNDL 12X100 (TROCAR) ×2 IMPLANT
TROCAR Z-THRD FIOS HNDL 11X100 (TROCAR) ×2 IMPLANT
TROCAR Z-THREAD FIOS 5X100MM (TROCAR) ×2 IMPLANT
TUBE CONNECTING 12X1/4 (SUCTIONS) ×2 IMPLANT
WARMER LAPAROSCOPE (MISCELLANEOUS) ×2 IMPLANT

## 2021-09-14 NOTE — Op Note (Signed)
[facility]  Date of Surgery: 09/14/2021  Admit Date: 09/13/2021   Performing Service: General  Surgeon(s) and Role:    * Virl Cagey, MD - Primary   Pre-operative Diagnosis: Acute Appendicitis  Post-operative Diagnosis: Acute Appendicitis  Procedure Performed: Laparoscopic Appendectomy   Surgeon: Lanell Matar. Constance Haw, MD   Assistant: No qualified resident was available.   Anesthesia: General   Findings:  The appendix was found to be inflamed. There were not signs of necrosis. There was not perforation. There was not abscess formation.   Estimated Blood Loss: Minimal   Specimens:  ID Type Source Tests Collected by Time Destination  1 : Appendix Tissue PATH Appendix SURGICAL PATHOLOGY Virl Cagey, MD [id number]      Complications: None; patient tolerated the procedure well.   Disposition: PACU - hemodynamically stable.   Condition: stable   Indications: The patient presented with a 1 day history of right-sided abdominal pain. A CT revealed findings consistent with acute appendicitis.   Procedure Details  Prior to the procedure, the risks, benefits, complications, treatment options, and expected outcomes were discussed with the patient and/or family, including but not limited to the risk of bleeding, infection, finding of a normal appendix, and the need for conversion to an open procedure. There was concurrence with the proposed plan and informed consent was obtained. The patient was taken to the operating room, identified as Mark Small and the procedure verified as Laproscopic Appendectomy.    The patient was placed in the supine position and general anesthesia was induced, along with placement of orogastric tube, SCD's, and a Foley catheter. The abdomen was prepped and draped in a sterile fashion. The abdomen was entered with Veress technique in the supraumbilical incision.  I could not get the saline to drop appropriately, so I went to the  left upper quadrant 2 finger breadths below the costal margin. An NG was in place. Intraperitoneal placement was confirmed with saline drop, low entry pressures, and easy insufflation. A 11 mm optiview trocar was placed under direct visualization at the supraumbilical site with a 0 degree scope. The 10 mm 0 degree scope was placed in the abdomen and no evidence of injury was identified in either location of the Veress attempts. A 12 mm port was placed in the left lower quadrant of the abdomen after skin incision with trocar placement under direct vision. A careful evaluation of the entire abdomen was carried out. An additional 5 mm port was placed in the suprapubic area under direct vision.  The patient was placed in Trendelenburg and left lateral decubitus position. The small intestines were retracted in the cephalad and left lateral direction away from the pelvis and right lower quadrant. The patient was found to have an dilated and inflamed appendix mostly at the tip. There was not evidence of perforation.   The appendix was carefully dissected. A window was made in the mesoappendix at the base of the appendix. The appendix was divided at its base using a standard endo-GIA stapler.  The staple line was inspected and noted to be within normal limits. The mesoappendix was taken with the harmonic energy device. The appendix was placed within an Endocatch specimen bag. There was no evidence of bleeding, leakage, or complication after division of the appendix.  Any remaining blood or pus was suctioned out from the abdomen, hemostasis was confirmed. The endocatch bag was removed via the 12 mm port, then the abdomen desufflated. The appendix was passed off the  field as a specimen.   The the 12 mm was closed with a 0 Vicryl with a suture passer, and 10 mm port sites was closed with a 0 Vicryl suture. The trocar site skin wounds were closed using subcuticular 4-0 Monocryl suture and dermabond. The patient was then  awakened from general anesthesia, extubated, and taken to PACU for recovery.   Instrument, sponge, and needle counts were correct at the conclusion of the case.   Curlene Labrum, MD [facility] [address] [place], [place] [id number]

## 2021-09-14 NOTE — Transfer of Care (Signed)
Immediate Anesthesia Transfer of Care Note  Patient: Mark Small  Procedure(s) Performed: APPENDECTOMY LAPAROSCOPIC (Abdomen)  Patient Location: PACU  Anesthesia Type:General  Level of Consciousness: awake, alert , oriented and patient cooperative  Airway & Oxygen Therapy: Patient Spontanous Breathing and Patient connected to nasal cannula oxygen  Post-op Assessment: Report given to RN, Post -op Vital signs reviewed and stable and Patient moving all extremities  Post vital signs: Reviewed and stable  Last Vitals:  Vitals Value Taken Time  BP 131/83 09/14/21 1018  Temp 37.1 C 09/14/21 1010  Pulse 91 09/14/21 1021  Resp 14 09/14/21 1021  SpO2 95 % 09/14/21 1021  Vitals shown include unvalidated device data.  Last Pain:  Vitals:   09/14/21 1010  TempSrc:   PainSc: 7       Patients Stated Pain Goal: 5 (09/14/21 1010)  Complications: No notable events documented.

## 2021-09-14 NOTE — Progress Notes (Signed)
[facility]  Updated his family regarding surgery. Will see if he wants to stay overnight based on how he feels in pacu.   Algis Greenhouse, MD [facility] [address] [place], [place] [address] [phone] (office)

## 2021-09-14 NOTE — Anesthesia Postprocedure Evaluation (Signed)
Anesthesia Post Note  Patient: Mark Small  Procedure(s) Performed: APPENDECTOMY LAPAROSCOPIC (Abdomen)  Patient location during evaluation: Phase II Anesthesia Type: General Level of consciousness: awake Pain management: pain level controlled Vital Signs Assessment: post-procedure vital signs reviewed and stable Respiratory status: spontaneous breathing and respiratory function stable Cardiovascular status: blood pressure returned to baseline and stable Postop Assessment: no headache and no apparent nausea or vomiting Anesthetic complications: no Comments: Late entry   No notable events documented.   Last Vitals:  Vitals:   09/14/21 1045 09/14/21 1108  BP: (!) 143/76 (!) 151/89  Pulse: 78 79  Resp: (!) 21 20  Temp:  36.7 C  SpO2: 96% 98%    Last Pain:  Vitals:   09/14/21 1108  TempSrc: Oral  PainSc: 5                  Windell Norfolk

## 2021-09-14 NOTE — Anesthesia Preprocedure Evaluation (Signed)
Anesthesia Evaluation  Patient identified by MRN, date of birth, ID band Patient awake    Reviewed: Allergy & Precautions, H&P , NPO status , Patient's Chart, lab work & pertinent test results, reviewed documented beta blocker date and time   Airway Mallampati: II  TM Distance: >3 FB Neck ROM: full    Dental no notable dental hx.    Pulmonary asthma ,    Pulmonary exam normal breath sounds clear to auscultation       Cardiovascular Exercise Tolerance: Good hypertension, negative cardio ROS   Rhythm:regular Rate:Normal     Neuro/Psych  Headaches, PSYCHIATRIC DISORDERS Anxiety    GI/Hepatic Neg liver ROS, GERD  Medicated,  Endo/Other  negative endocrine ROSdiabetes, Type 2  Renal/GU negative Renal ROS  negative genitourinary   Musculoskeletal   Abdominal   Peds  Hematology negative hematology ROS (+)   Anesthesia Other Findings   Reproductive/Obstetrics negative OB ROS                             Anesthesia Physical Anesthesia Plan  ASA: 2 and emergent  Anesthesia Plan: General and General ETT   Post-op Pain Management:    Induction:   PONV Risk Score and Plan: Ondansetron  Airway Management Planned:   Additional Equipment:   Intra-op Plan:   Post-operative Plan:   Informed Consent: I have reviewed the patients History and Physical, chart, labs and discussed the procedure including the risks, benefits and alternatives for the proposed anesthesia with the patient or authorized representative who has indicated his/her understanding and acceptance.     Dental Advisory Given  Plan Discussed with: CRNA  Anesthesia Plan Comments:         Anesthesia Quick Evaluation

## 2021-09-14 NOTE — ED Provider Notes (Signed)
Patient signed out to me by Dr. Effie Shy to follow-up on CT scan.  CT scan has been performed and reviewed.  Patient has findings of early, uncomplicated appendicitis.  Discussed with Dr. Henreitta Leber, general surgery.  Patient will be held in the ED and she will see him this morning to schedule surgical intervention.   Gilda Crease, MD 09/14/21 [phone]

## 2021-09-14 NOTE — Anesthesia Procedure Notes (Signed)
Procedure Name: Intubation Date/Time: 09/14/2021 8:40 AM  Performed by: Myna Bright, CRNAPre-anesthesia Checklist: Patient identified, Emergency Drugs available, Suction available and Patient being monitored Patient Re-evaluated:Patient Re-evaluated prior to induction Oxygen Delivery Method: Circle system utilized Preoxygenation: Pre-oxygenation with 100% oxygen Induction Type: IV induction and Rapid sequence Laryngoscope Size: Mac and 4 Grade View: Grade I Tube type: Oral Tube size: 7.5 mm Number of attempts: 1 Airway Equipment and Method: Stylet Placement Confirmation: ETT inserted through vocal cords under direct vision, positive ETCO2 and breath sounds checked- equal and bilateral Secured at: 22 cm Tube secured with: Tape Dental Injury: Teeth and Oropharynx as per pre-operative assessment

## 2021-09-14 NOTE — H&P (Signed)
[facility] History and Physical  Reason for Referral: Acute appendicitis  Referring Physician:  Dr. Waverly Ferrari   Chief Complaint   Abdominal Pain     Mark Small is a 26 y.o. male.  HPI: Mark Small is a 26 yo with obesity, DM, and HTN who comes in with abdominal pain and nausea/vomiting that started the day before. He was worked up in the ED and found to have a leukocytosis and findings of appendicitis on CT. He says that for the past month he did have some nausea vomiting but thought this was related to his medication changes.  Past Medical History:  Diagnosis Date   Anxiety    Asthma    Diabetes mellitus without complication ([place])    Diarrhea    Family history of adverse reaction to anesthesia    PGM has PONV   Fatty liver    GERD (gastroesophageal reflux disease)    Headache    History of seasonal allergies    Hyperlipidemia    Hypertension    Obesity    Prediabetes     Past Surgical History:  Procedure Laterality Date   BIOPSY  09/25/2017   Procedure: BIOPSY;  Surgeon: Ronnette Juniper, MD;  Location: [place];  Service: Gastroenterology;;   CIRCUMCISION     COLONOSCOPY N/A 09/25/2017   Procedure: COLONOSCOPY;  Surgeon: Ronnette Juniper, MD;  Location: [place];  Service: Gastroenterology;  Laterality: N/A;   ESOPHAGOGASTRODUODENOSCOPY N/A 09/25/2017   Procedure: ESOPHAGOGASTRODUODENOSCOPY (EGD);  Surgeon: Ronnette Juniper, MD;  Location: [place];  Service: Gastroenterology;  Laterality: N/A;   ORIF TIBIA & FIBULA FRACTURES     age 58   TONSILLECTOMY AND ADENOIDECTOMY     WISDOM TOOTH EXTRACTION      Family History  Problem Relation Age of Onset   Obesity Mother        gastric bypass 8 years ago   Obesity Father    Hypertension Father    Hyperlipidemia Father    Obesity Maternal Grandmother    Obesity Maternal Grandfather    Heart disease Neg Hx     Social History   Tobacco Use   Smoking status: Never   Smokeless tobacco: Never  Vaping  Use   Vaping Use: Some days  Substance Use Topics   Alcohol use: Yes    Comment: social   Drug use: Never    Medications: I have reviewed the patient's current medications. Current Facility-Administered Medications  Medication Dose Route Frequency Provider Last Rate Last Admin   [MAR Hold] morphine (PF) 4 MG/ML injection 4 mg  4 mg Intravenous Q2H PRN Orpah Greek, MD   4 mg at 09/14/21 0420    Medications Prior to Admission  Medication Sig Dispense Refill   albuterol (PROVENTIL HFA;VENTOLIN HFA) 108 (90 Base) MCG/ACT inhaler Inhale into the lungs every 4 (four) hours as needed for wheezing or shortness of breath.     chlorthalidone (HYGROTON) 25 MG tablet Take 25 mg by mouth every morning.     colestipol (COLESTID) 1 g tablet Take 2 g by mouth daily.     fexofenadine (ALLEGRA) 180 MG tablet Take 180 mg by mouth daily.       lisinopril (ZESTRIL) 2.5 MG tablet Take 2.5 mg by mouth daily.     montelukast (SINGULAIR) 10 MG tablet Take 10 mg by mouth daily.     pravastatin (PRAVACHOL) 20 MG tablet Take 20 mg by mouth daily.     TRULICITY 3 0000000 SOPN Inject  0.5 mLs into the skin once a week.     venlafaxine XR (EFFEXOR-XR) 75 MG 24 hr capsule Take 75 mg by mouth daily.     metFORMIN (GLUCOPHAGE XR) 500 MG 24 hr tablet Take 2 tablets (1,000 mg total) by mouth 2 (two) times daily with a meal. (Patient not taking: Reported on 09/20/2017) 270 tablet 4    No Known Allergies    ROS:  A comprehensive review of systems was negative except for: Gastrointestinal: positive for abdominal pain, nausea, and vomiting  Blood pressure (!) 141/81, pulse 84, temperature 98 F (36.7 C), resp. rate (!) 23, height 6' (1.829 m), weight (!) 174.6 kg, SpO2 98 %. Physical Exam Vitals reviewed.  Constitutional:      Appearance: He is obese.  HENT:     Head: Atraumatic.  Eyes:     Extraocular Movements: Extraocular movements intact.  Cardiovascular:     Rate and Rhythm: Normal rate and  regular rhythm.  Pulmonary:     Effort: Pulmonary effort is normal.  Abdominal:     Palpations: Abdomen is soft.     Tenderness: There is abdominal tenderness in the right lower quadrant, periumbilical area and suprapubic area.  Skin:    General: Skin is warm.  Neurological:     General: No focal deficit present.     Mental Status: He is disoriented.  Psychiatric:        Mood and Affect: Mood normal.     Results: Results for orders placed or performed during the hospital encounter of 09/13/21 (from the past 48 hour(s))  Urinalysis, Routine w reflex microscopic Urine, Clean Catch     Status: Abnormal   Collection Time: 09/13/21  3:10 AM  Result Value Ref Range   Color, Urine YELLOW YELLOW   APPearance CLEAR CLEAR   Specific Gravity, Urine 1.021 1.005 - 1.030   pH 6.0 5.0 - 8.0   Glucose, UA NEGATIVE NEGATIVE mg/dL   Hgb urine dipstick SMALL (A) NEGATIVE   Bilirubin Urine NEGATIVE NEGATIVE   Ketones, ur 5 (A) NEGATIVE mg/dL   Protein, ur 100 (A) NEGATIVE mg/dL   Nitrite NEGATIVE NEGATIVE   Leukocytes,Ua NEGATIVE NEGATIVE   RBC / HPF 0-5 0 - 5 RBC/hpf   WBC, UA 0-5 0 - 5 WBC/hpf   Bacteria, UA NONE SEEN NONE SEEN   Squamous Epithelial / LPF 0-5 0 - 5   Mucus PRESENT     Comment: Performed at [facility], [address] [place], [place] [address]  Lipase, blood     Status: None   Collection Time: 09/13/21 11:54 PM  Result Value Ref Range   Lipase 22 11 - 51 U/L    Comment: Performed at [facility], [address], [place], [place] [address]  Comprehensive metabolic panel     Status: Abnormal   Collection Time: 09/13/21 11:54 PM  Result Value Ref Range   Sodium 136 135 - 145 mmol/L   Potassium 2.7 (LL) 3.5 - 5.1 mmol/L    Comment: CRITICAL RESULT CALLED TO, READ BACK BY AND VERIFIED WITH: B. CRABTREE ON 06.08.23 AT 0032 BY ADGER J     Chloride 99 98 - 111 mmol/L   CO2 25 22 - 32 mmol/L   Glucose, Bld 153 (H) 70 - 99 mg/dL    Comment: Glucose reference range applies  only to samples taken after fasting for at least 8 hours.   BUN 16 6 - 20 mg/dL   Creatinine, Ser 0.95 0.61 - 1.24  mg/dL   Calcium 10.1 8.9 - 10.3 mg/dL   Total Protein 8.5 (H) 6.5 - 8.1 g/dL   Albumin 4.8 3.5 - 5.0 g/dL   AST 24 15 - 41 U/L   ALT 47 (H) 0 - 44 U/L   Alkaline Phosphatase 54 38 - 126 U/L   Total Bilirubin 1.4 (H) 0.3 - 1.2 mg/dL   GFR, Estimated >60 >60 mL/min    Comment: (NOTE) Calculated using the CKD-EPI Creatinine Equation (2021)    Anion gap 12 5 - 15    Comment: Performed at [facility], [address], [place], [place] [address]  CBC     Status: Abnormal   Collection Time: 09/13/21 11:54 PM  Result Value Ref Range   WBC 19.6 (H) 4.0 - 10.5 K/uL   RBC 5.24 4.22 - 5.81 MIL/uL   Hemoglobin 15.5 13.0 - 17.0 g/dL   HCT 45.0 39.0 - 52.0 %   MCV 85.9 80.0 - 100.0 fL   MCH 29.6 26.0 - 34.0 pg   MCHC 34.4 30.0 - 36.0 g/dL   RDW 12.9 11.5 - 15.5 %   Platelets 371 150 - 400 K/uL   nRBC 0.0 0.0 - 0.2 %    Comment: Performed at [facility], [address] [place], [place] [address]  Glucose, capillary     Status: Abnormal   Collection Time: 09/14/21  7:31 AM  Result Value Ref Range   Glucose-Capillary 140 (H) 70 - 99 mg/dL    Comment: Glucose reference range applies only to samples taken after fasting for at least 8 hours.   Personally reviewed dilated appendix with inflammation surrounding it CT Abdomen Pelvis W Contrast  Result Date: 09/14/2021 CLINICAL DATA:  Acute abdominal pain for several days, initial encounter EXAM: CT ABDOMEN AND PELVIS WITH CONTRAST TECHNIQUE: Multidetector CT imaging of the abdomen and pelvis was performed using the standard protocol following bolus administration of intravenous contrast. RADIATION DOSE REDUCTION: This exam was performed according to the departmental dose-optimization program which includes automated exposure control, adjustment of the mA and/or kV according to patient size and/or use of iterative reconstruction technique.  CONTRAST:  149mL OMNIPAQUE IOHEXOL 300 MG/ML  SOLN COMPARISON:  None Available. FINDINGS: Lower chest: No acute abnormality. Hepatobiliary: Mild fatty infiltration of the liver is noted. Gallbladder is within normal limits. Pancreas: Unremarkable. No pancreatic ductal dilatation or surrounding inflammatory changes. Spleen: Normal in size without focal abnormality. Adrenals/Urinary Tract: Adrenal glands are within normal limits. No renal calculi or obstructive changes are seen. The ureters are unremarkable. The bladder is decompressed. Stomach/Bowel: Colon is predominately decompressed. No obstructive or inflammatory changes are seen. The appendix is well visualized and dilated to up to 20 mm at the distal tip with some Peri appendiceal inflammatory changes consistent with appendicitis. No perforation or abscess is noted at this time. Small bowel and stomach are within normal limits. Vascular/Lymphatic: No significant vascular findings are present. No enlarged abdominal or pelvic lymph nodes. Reproductive: Prostate is unremarkable. Other: No abdominal wall hernia or abnormality. No abdominopelvic ascites. Musculoskeletal: No acute or significant osseous findings. IMPRESSION: Changes consistent with peripheral appendicitis without evidence of perforation or abscess formation. Fatty liver. No other focal abnormality is noted. Electronically Signed   By: Inez Catalina M.D.   On: 09/14/2021 04:01     Assessment & Plan:  HALLET OBENOUR is a 26 y.o. male with acute appendicitis.  -Discussed the risk of laparoscopic appendectomy and the option of antibiotics alone. Discussed that in [place] and  some trials in the [place], antibiotics are used for simple appendicitis. Discussed that research shows a 40% failure rate for antibiotics alone.  Discussed risk of surgery including but not limited to bleeding, infection, injury to other organs, normal appendix, and after this discussion the patient has decided to proceed with  surgery.  May have to stay pending how he feels after surgery.  All questions were answered to the satisfaction of the patient.    Virl Cagey 09/14/2021, 8:12 AM

## 2021-09-14 NOTE — Discharge Instructions (Addendum)
Discharge Laparoscopic Surgery Instructions:  Common Complaints: Right shoulder pain is common after laparoscopic surgery. This is secondary to the gas used in the surgery being trapped under the diaphragm.  Walk to help your body absorb the gas. This will improve in a few days. Pain at the port sites are common, especially the larger port sites. This will improve with time.  Some nausea is common and poor appetite. The main goal is to stay hydrated the first few days after surgery.   Diet/ Activity: Diet as tolerated. You may not have an appetite, but it is important to stay hydrated. Drink 64 ounces of water a day. Your appetite will return with time.  Shower per your regular routine daily.  Do not take hot showers. Take warm showers that are less than 10 minutes. Rest and listen to your body, but do not remain in bed all day.  Walk everyday for at least 15-20 minutes. Deep cough and move around every 1-2 hours in the first few days after surgery.  Do not lift > 10 lbs, perform excessive bending, pushing, pulling, squatting for 1 week after surgery.  Do not pick at the dermabond glue on your incision sites.  This glue film will remain in place for 1-2 weeks and will start to peel off.  Do not place lotions or balms on your incision unless instructed to specifically by Dr. Laine Fonner.   Pain Expectations and Narcotics: -After surgery you will have pain associated with your incisions and this is normal. The pain is muscular and nerve pain, and will get better with time. -You are encouraged and expected to take non narcotic medications like tylenol and ibuprofen (when able) to treat pain as multiple modalities can aid with pain treatment. -Narcotics are only used when pain is severe or there is breakthrough pain. -You are not expected to have a pain score of 0 after surgery, as we cannot prevent pain. A pain score of 3-4 that allows you to be functional, move, walk, and tolerate some activity is the  goal. The pain will continue to improve over the days after surgery and is dependent on your surgery. -Due to [place] law, we are only able to give a certain amount of pain medication to treat post operative pain, and we only give additional narcotics on a patient by patient basis.  -For most laparoscopic surgery, studies have shown that the majority of patients only need 10-15 narcotic pills, and for open surgeries most patients only need 15-20.   -Having appropriate expectations of pain and knowledge of pain management with non narcotics is important as we do not want anyone to become addicted to narcotic pain medication.  -Using ice packs in the first 48 hours and heating pads after 48 hours, wearing an abdominal binder (when recommended), and using over the counter medications are all ways to help with pain management.   -Simple acts like meditation and mindfulness practices after surgery can also help with pain control and research has proven the benefit of these practices.  Medication: Take tylenol and ibuprofen as needed for pain control, alternating every 4-6 hours.  Example:  Tylenol 1000mg @ 6am, 12noon, 6pm, 12midnight (Do not exceed 4000mg of tylenol a day). Ibuprofen 800mg @ 9am, 3pm, 9pm, 3am (Do not exceed 3600mg of ibuprofen a day).  Take Roxicodone for breakthrough pain every 4 hours.  Take Colace for constipation related to narcotic pain medication. If you do not have a bowel movement in 2 days, take Miralax   over the counter.  Drink plenty of water to also prevent constipation.   Contact Information: If you have questions or concerns, please call our office, [phone], Monday- Thursday 8AM-5PM and Friday 8AM-12Noon.  If it is after hours or on the weekend, please call [facility] Main [facility], [phone], [phone], and ask to speak to the surgeon on call for Dr. Tamaria Dunleavy at [place].   

## 2021-09-15 ENCOUNTER — Encounter (HOSPITAL_COMMUNITY): Payer: Self-pay | Admitting: General Surgery

## 2021-09-15 LAB — SURGICAL PATHOLOGY

## 2021-09-28 ENCOUNTER — Ambulatory Visit (INDEPENDENT_AMBULATORY_CARE_PROVIDER_SITE_OTHER): Payer: Self-pay | Admitting: General Surgery

## 2021-09-28 DIAGNOSIS — K353 Acute appendicitis with localized peritonitis, without perforation or gangrene: Secondary | ICD-10-CM

## 2021-09-29 NOTE — Progress Notes (Signed)
[facility]  I am calling the patient for post operative evaluation. This is not a billable encounter as it is under the global charges for the surgery.  The patient had a laparoscopic appendectomy on 6/8. The patient reports that he is doing well. The are tolerating a diet, having good pain control, and having regular Bms.  The incisions are healing and glue is peeling. The patient has no concerns.   Pathology: Clinical History: acute appendicitis   FINAL MICROSCOPIC DIAGNOSIS:   A. APPENDIX, APPENDECTOMY:  - Acute appendicitis   Will see the patient PRN.    Mark Greenhouse, MD [facility] [address] [place], [place] [address] [phone] (office)

## 2022-04-11 ENCOUNTER — Emergency Department (HOSPITAL_COMMUNITY): Payer: BC Managed Care – PPO

## 2022-04-11 ENCOUNTER — Encounter (HOSPITAL_COMMUNITY): Payer: Self-pay | Admitting: *Deleted

## 2022-04-11 ENCOUNTER — Other Ambulatory Visit: Payer: Self-pay

## 2022-04-11 ENCOUNTER — Emergency Department (HOSPITAL_COMMUNITY)
Admission: EM | Admit: 2022-04-11 | Discharge: 2022-04-11 | Disposition: A | Discharge status: Home / Self Care | Payer: BC Managed Care – PPO | Attending: Emergency Medicine | Admitting: Emergency Medicine

## 2022-04-11 DIAGNOSIS — E119 Type 2 diabetes mellitus without complications: Secondary | ICD-10-CM | POA: Diagnosis not present

## 2022-04-11 DIAGNOSIS — Z1152 Encounter for screening for COVID-19: Secondary | ICD-10-CM | POA: Insufficient documentation

## 2022-04-11 DIAGNOSIS — D72829 Elevated white blood cell count, unspecified: Secondary | ICD-10-CM | POA: Insufficient documentation

## 2022-04-11 DIAGNOSIS — I10 Essential (primary) hypertension: Secondary | ICD-10-CM | POA: Diagnosis not present

## 2022-04-11 DIAGNOSIS — E876 Hypokalemia: Secondary | ICD-10-CM | POA: Insufficient documentation

## 2022-04-11 DIAGNOSIS — Z79899 Other long term (current) drug therapy: Secondary | ICD-10-CM | POA: Diagnosis not present

## 2022-04-11 DIAGNOSIS — R1013 Epigastric pain: Secondary | ICD-10-CM

## 2022-04-11 DIAGNOSIS — A084 Viral intestinal infection, unspecified: Secondary | ICD-10-CM | POA: Diagnosis not present

## 2022-04-11 DIAGNOSIS — Z7984 Long term (current) use of oral hypoglycemic drugs: Secondary | ICD-10-CM | POA: Insufficient documentation

## 2022-04-11 DIAGNOSIS — J45909 Unspecified asthma, uncomplicated: Secondary | ICD-10-CM | POA: Diagnosis not present

## 2022-04-11 DIAGNOSIS — R112 Nausea with vomiting, unspecified: Secondary | ICD-10-CM

## 2022-04-11 LAB — URINALYSIS, ROUTINE W REFLEX MICROSCOPIC
Bacteria, UA: NONE SEEN
Bilirubin Urine: NEGATIVE
Glucose, UA: NEGATIVE mg/dL
Hgb urine dipstick: NEGATIVE
Ketones, ur: NEGATIVE mg/dL
Leukocytes,Ua: NEGATIVE
Nitrite: NEGATIVE
Protein, ur: 100 mg/dL — AB
Specific Gravity, Urine: 1.027 (ref 1.005–1.030)
pH: 6 (ref 5.0–8.0)

## 2022-04-11 LAB — CBC WITH DIFFERENTIAL/PLATELET
Abs Immature Granulocytes: 0.12 10*3/uL — ABNORMAL HIGH (ref 0.00–0.07)
Basophils Absolute: 0 10*3/uL (ref 0.0–0.1)
Basophils Relative: 0 %
Eosinophils Absolute: 0.6 10*3/uL — ABNORMAL HIGH (ref 0.0–0.5)
Eosinophils Relative: 4 %
HCT: 45.4 % (ref 39.0–52.0)
Hemoglobin: 15.2 g/dL (ref 13.0–17.0)
Immature Granulocytes: 1 %
Lymphocytes Relative: 12 %
Lymphs Abs: 2.1 10*3/uL (ref 0.7–4.0)
MCH: 29.5 pg (ref 26.0–34.0)
MCHC: 33.5 g/dL (ref 30.0–36.0)
MCV: 88.2 fL (ref 80.0–100.0)
Monocytes Absolute: 1.2 10*3/uL — ABNORMAL HIGH (ref 0.1–1.0)
Monocytes Relative: 7 %
Neutro Abs: 12.9 10*3/uL — ABNORMAL HIGH (ref 1.7–7.7)
Neutrophils Relative %: 76 %
Platelets: 346 10*3/uL (ref 150–400)
RBC: 5.15 MIL/uL (ref 4.22–5.81)
RDW: 13.5 % (ref 11.5–15.5)
WBC: 16.9 10*3/uL — ABNORMAL HIGH (ref 4.0–10.5)
nRBC: 0 % (ref 0.0–0.2)

## 2022-04-11 LAB — COMPREHENSIVE METABOLIC PANEL
ALT: 41 U/L (ref 0–44)
AST: 21 U/L (ref 15–41)
Albumin: 4.3 g/dL (ref 3.5–5.0)
Alkaline Phosphatase: 60 U/L (ref 38–126)
Anion gap: 12 (ref 5–15)
BUN: 17 mg/dL (ref 6–20)
CO2: 28 mmol/L (ref 22–32)
Calcium: 9.3 mg/dL (ref 8.9–10.3)
Chloride: 97 mmol/L — ABNORMAL LOW (ref 98–111)
Creatinine, Ser: 0.69 mg/dL (ref 0.61–1.24)
GFR, Estimated: >60 mL/min (ref >60)
Glucose, Bld: 101 mg/dL — ABNORMAL HIGH (ref 70–99)
Potassium: 3.1 mmol/L — ABNORMAL LOW (ref 3.5–5.1)
Sodium: 137 mmol/L (ref 135–145)
Total Bilirubin: 1.1 mg/dL (ref 0.3–1.2)
Total Protein: 7.7 g/dL (ref 6.5–8.1)

## 2022-04-11 LAB — RESP PANEL BY RT-PCR (RSV, FLU A&B, COVID)  RVPGX2
Influenza A by PCR: NEGATIVE
Influenza B by PCR: NEGATIVE
Resp Syncytial Virus by PCR: NEGATIVE
SARS Coronavirus 2 by RT PCR: NEGATIVE

## 2022-04-11 LAB — LIPASE, BLOOD: Lipase: 32 U/L (ref 11–51)

## 2022-04-11 LAB — MAGNESIUM: Magnesium: 1.9 mg/dL (ref 1.7–2.4)

## 2022-04-11 MED ORDER — ONDANSETRON 4 MG PO TBDP: 4.0000 mg | ORAL_TABLET | Freq: Three times a day (TID) | ORAL | 0 refills | Status: AC | PRN

## 2022-04-11 MED ORDER — POTASSIUM CHLORIDE CRYS ER 20 MEQ PO TBCR
40.0000 meq | EXTENDED_RELEASE_TABLET | Freq: Once | ORAL | Status: AC
  Administered 2022-04-11: 40 meq via ORAL
  Filled 2022-04-11: qty 2

## 2022-04-11 MED ORDER — SODIUM CHLORIDE 0.9 % IV BOLUS
1000.0000 mL | Freq: Once | INTRAVENOUS | Status: AC
Start: 2022-04-11 — End: 2022-04-11
  Administered 2022-04-11: 1000 mL via INTRAVENOUS

## 2022-04-11 MED ORDER — ONDANSETRON HCL 4 MG/2ML IJ SOLN
4.0000 mg | Freq: Once | INTRAMUSCULAR | Status: AC
  Administered 2022-04-11: 4 mg via INTRAVENOUS
  Filled 2022-04-11: qty 2

## 2022-04-11 MED ORDER — PANTOPRAZOLE SODIUM 20 MG PO TBEC: 20.0000 mg | DELAYED_RELEASE_TABLET | Freq: Every day | ORAL | 1 refills | Status: AC

## 2022-04-11 MED ORDER — LOPERAMIDE HCL 2 MG PO CAPS
4.0000 mg | ORAL_CAPSULE | Freq: Once | ORAL | Status: AC
  Administered 2022-04-11: 4 mg via ORAL
  Filled 2022-04-11: qty 2

## 2022-04-11 MED ORDER — IOHEXOL 300 MG/ML  SOLN
100.0000 mL | Freq: Once | INTRAMUSCULAR | Status: AC | PRN
  Administered 2022-04-11: 100 mL via INTRAVENOUS

## 2022-04-11 NOTE — ED Triage Notes (Signed)
Pt c/o intermittent upper abdominal pain x 6 days with n/v/d

## 2022-04-11 NOTE — Discharge Instructions (Signed)
Note the workup today was overall reassuring.  As discussed, recommend taking Zofran as needed for nausea/vomiting.  Will also add a reflux medicine in the form of pantoprazole to take daily.  You can find antidiarrheal medicine loperamide/Imodium over-the-counter to local pharmacy.  Recommend electrolyte rich fluids in the form of body armor, Pedialyte, sugar-free Gatorade, liquid IV.  Recommend reevaluation by primary care for reassessment of your symptoms.  Please do not hesitate to return to emergency department if the worrisome signs and symptoms we discussed become apparent.

## 2022-04-11 NOTE — ED Notes (Signed)
Patient transported to CT 

## 2022-04-11 NOTE — ED Provider Notes (Signed)
[facility] EMERGENCY DEPARTMENT Provider Note   CSN: [id number] Arrival date & time: 04/11/22  1113     History  Chief Complaint  Patient presents with   Abdominal Pain    Mark Small is a 27 y.o. male.   Abdominal Pain   27 year old male presents emergency department with complaints of epigastric abdominal pain.  Patient reports symptoms beginning approximately 6 to 7 days ago.  Notes accompanying nausea, vomiting, diarrhea that has been persistent since onset.  Has taken no over-the-counter medications for this.  Denies fever, chills, hematemesis, hematochezia, melena, chest pain, shortness of breath, cough, congestion, sore throat.  Prior abdominal surgery of appendectomy.  Past medical history significant for diabetes mellitus, GERD, hyperlipidemia, hypertension, obesity, asthma, anxiety  Home Medications Prior to Admission medications   Medication Sig Start Date End Date Taking? Authorizing Provider  ondansetron (ZOFRAN-ODT) 4 MG disintegrating tablet Take 1 tablet (4 mg total) by mouth every 8 (eight) hours as needed for nausea or vomiting. 04/11/22  Yes Dion Saucier A, PA  pantoprazole (PROTONIX) 20 MG tablet Take 1 tablet (20 mg total) by mouth daily. 04/11/22  Yes Dion Saucier A, PA  albuterol (PROVENTIL HFA;VENTOLIN HFA) 108 (90 Base) MCG/ACT inhaler Inhale into the lungs every 4 (four) hours as needed for wheezing or shortness of breath.    [provider]  chlorthalidone (HYGROTON) 25 MG tablet Take 25 mg by mouth every morning. 08/21/21   [provider]  colestipol (COLESTID) 1 g tablet Take 2 g by mouth daily. 08/02/21   [provider]  fexofenadine (ALLEGRA) 180 MG tablet Take 180 mg by mouth daily.      [provider]  lisinopril (ZESTRIL) 2.5 MG tablet Take 2.5 mg by mouth daily. 08/21/21   [provider]  metFORMIN (GLUCOPHAGE XR) 500 MG 24 hr tablet Take 2 tablets (1,000 mg total) by mouth 2 (two) times daily with  a meal. Patient not taking: Reported on 09/20/2017 03/22/11 03/21/12  Lelon Huh, MD  montelukast (SINGULAIR) 10 MG tablet Take 10 mg by mouth daily. 06/06/21   [provider]  oxyCODONE (ROXICODONE) 5 MG immediate release tablet Take 1 tablet (5 mg total) by mouth every 4 (four) hours as needed for severe pain or breakthrough pain. 09/14/21 09/14/22  Virl Cagey, MD  pravastatin (PRAVACHOL) 20 MG tablet Take 20 mg by mouth daily. 08/21/21   [provider]  TRULICITY 3 EL/9.5VU SOPN Inject 0.5 mLs into the skin once a week. 09/13/21   [provider]  venlafaxine XR (EFFEXOR-XR) 75 MG 24 hr capsule Take 75 mg by mouth daily. 08/02/21   [provider]      Allergies    Patient has no known allergies.    Review of Systems   Review of Systems  Gastrointestinal:  Positive for abdominal pain.  All other systems reviewed and are negative.   Physical Exam Updated Vital Signs BP (!) 142/94 (BP Location: Left Wrist)   Pulse 92   Temp 98.1 F (36.7 C) (Oral)   Resp 20   Ht 6' (1.829 m)   Wt (!) 170.1 kg   SpO2 99%   BMI 50.86 kg/m  Physical Exam Vitals and nursing note reviewed.  Constitutional:      General: He is not in acute distress.    Appearance: He is well-developed.  HENT:     Head: Normocephalic and atraumatic.  Eyes:     Conjunctiva/sclera: Conjunctivae normal.  Cardiovascular:  Rate and Rhythm: Normal rate and regular rhythm.     Heart sounds: No murmur heard. Pulmonary:     Effort: Pulmonary effort is normal. No respiratory distress.     Breath sounds: Normal breath sounds. No stridor. No wheezing, rhonchi or rales.  Abdominal:     Palpations: Abdomen is soft.     Tenderness: There is abdominal tenderness in the epigastric area.  Musculoskeletal:        General: No swelling.     Cervical back: Neck supple.  Skin:    General: Skin is warm and dry.     Capillary Refill: Capillary refill takes less than 2 seconds.   Neurological:     Mental Status: He is alert.  Psychiatric:        Mood and Affect: Mood normal.     ED Results / Procedures / Treatments   Labs (all labs ordered are listed, but only abnormal results are displayed) Labs Reviewed  URINALYSIS, ROUTINE W REFLEX MICROSCOPIC - Abnormal; Notable for the following components:      Result Value   Protein, ur 100 (*)    All other components within normal limits  COMPREHENSIVE METABOLIC PANEL - Abnormal; Notable for the following components:   Potassium 3.1 (*)    Chloride 97 (*)    Glucose, Bld 101 (*)    All other components within normal limits  CBC WITH DIFFERENTIAL/PLATELET - Abnormal; Notable for the following components:   WBC 16.9 (*)    Neutro Abs 12.9 (*)    Monocytes Absolute 1.2 (*)    Eosinophils Absolute 0.6 (*)    Abs Immature Granulocytes 0.12 (*)    All other components within normal limits  RESP PANEL BY RT-PCR (RSV, FLU A&B, COVID)  RVPGX2  LIPASE, BLOOD  MAGNESIUM    EKG None  Radiology CT Abdomen Pelvis W Contrast  Result Date: 04/11/2022 CLINICAL DATA:  Upper abdominal pain for 6 days.  Pancreatitis. EXAM: CT ABDOMEN AND PELVIS WITH CONTRAST TECHNIQUE: Multidetector CT imaging of the abdomen and pelvis was performed using the standard protocol following bolus administration of intravenous contrast. RADIATION DOSE REDUCTION: This exam was performed according to the departmental dose-optimization program which includes automated exposure control, adjustment of the mA and/or kV according to patient size and/or use of iterative reconstruction technique. CONTRAST:  OMNIPAQUE IOHEXOL 300 MG/ML  SOLN COMPARISON:  09/14/2021 FINDINGS: Body habitus reduces diagnostic sensitivity and specificity. Lower chest: Stable punctate calcification along the right hemidiaphragm, image 10 series 2. Hepatobiliary: Unremarkable Pancreas: No substantial peripancreatic stranding is currently identified. No findings of pancreatic  necrosis, abscess, or pseudocyst. Spleen: Unremarkable Adrenals/Urinary Tract: Unremarkable Stomach/Bowel: Unremarkable Vascular/Lymphatic: Borderline prominent left external iliac node 1.0 cm in short axis on image 95 series 2. This previously measured same. Reproductive: Unremarkable Other: No supplemental non-categorized findings. Musculoskeletal: Degenerative disc disease at L4-5 and to a lesser extent at L3-4. IMPRESSION: 1. No current imaging findings of active pancreatitis. Correlate with lipase levels. 2. Degenerative disc disease at L4-5 and to a lesser extent at L3-4. Electronically Signed   By: Gaylyn Rong M.D.   On: 04/11/2022 16:09    Procedures Procedures    Medications Ordered in ED Medications  sodium chloride 0.9 % bolus 1,000 mL (0 mLs Intravenous Stopped 04/11/22 1432)  ondansetron (ZOFRAN) injection 4 mg (4 mg Intravenous Given 04/11/22 1249)  loperamide (IMODIUM) capsule 4 mg (4 mg Oral Given 04/11/22 1344)  iohexol (OMNIPAQUE) 300 MG/ML solution 100 mL (100 mLs Intravenous Contrast  Given 04/11/22 1547)  potassium chloride SA (KLOR-CON M) CR tablet 40 mEq (40 mEq Oral Given 04/11/22 1505)    ED Course/ Medical Decision Making/ A&P Clinical Course as of 04/11/22 1705  Wed Apr 11, 2022  1311 Shared decision-making conversation was had regarding imaging of the abdomen.  Patient elected for imaging of the abdomen over observation of symptoms with medicines administered.  I again discussed with patient regarding low suspicion for CT scan adding any new information to the clinical picture but patient insisted.  CT scan was ordered [CR]    Clinical Course User Index [CR] Wilnette Kales, PA                           Medical Decision Making Amount and/or Complexity of Data Reviewed Labs: ordered. Radiology: ordered.  Risk Prescription drug management.   This patient presents to the ED for concern of abdominal pain, this involves an extensive number of treatment options,  and is a complaint that carries with it a high risk of complications and morbidity.  The differential diagnosis includes pancreatitis, GERD, PUD, nephrolithiasis, CBD pathology, cholecystitis, SBO/LBO, nephrolithiasis, pyelonephritis, cystitis, diverticulitis, appendicitis, AAA, mesenteric ischemia   Co morbidities that complicate the patient evaluation  See HPI   Additional history obtained:  Additional history obtained from EMR External records from outside source obtained and reviewed including hospital records   Lab Tests:  I Ordered, and personally interpreted labs.  The pertinent results include: Leukocytosis of 16.9.  No evidence anemia.  Platelets within normal range.  Hypokalemia with a potassium of 3.10 which was supplemented orally with normal magnesium.  Decrease in chloride of 97 hours which patient was given 1 L normal saline.  No transaminitis.  No renal dysfunction.  Lipase within normal limits.  UA within normal limits besides 100 protein.  Respiratory viral panel negative.   Imaging Studies ordered:  I ordered imaging studies including CT abdomen pelvis I independently visualized and interpreted imaging which showed no acute intra-abdominal pathology.  Degenerative disc disease L4-L5 and to lesser extent L3-L4 I agree with the radiologist interpretation  Cardiac Monitoring: / EKG:  The patient was maintained on a cardiac monitor.  I personally viewed and interpreted the cardiac monitored which showed an underlying rhythm of: Sinus rhythm   Consultations Obtained:  N/a   Problem List / ED Course / Critical interventions / Medication management  Viral gastroenteritis I ordered medication including Zofran for nausea, Imodium for diarrhea.  1 L normal saline for rehydration.  Potassium chloride for hypokalemia  Reevaluation of the patient after these medicines showed that the patient improved I have reviewed the patients home medicines and have made adjustments  as needed   Social Determinants of Health:  Denies tobacco, licit drug use.   Test / Admission - Considered:  Viral gastroenteritis Vitals signs significant for hypertension with blood pressure 142/94.  Recommend follow-up with primary care regarding elevation blood pressure.. Otherwise within normal range and stable throughout visit. Laboratory/imaging studies significant for: See above Patient with symptoms most likely secondary to viral gastroenteritis.  Patient's nausea vomiting controlled with oral Zofran administered with no repeat episodes of emesis and tolerating p.o. at this time.  Patient with overall reassuring workup with negative CT imaging of any acute abnormalities.  Recommend continued outpatient therapy of the same Zofran as needed, proper oral rehydration via electrolyte rich fluids, Tylenol/Motrin as needed for pain.  Recommend follow-up with primary care for reassessment.  Treatment plan discussed at length with patient he acknowledged understanding was agreeable to said plan. Worrisome signs and symptoms were discussed with the patient, and the patient acknowledged understanding to return to the ED if noticed. Patient was stable upon discharge.          Final Clinical Impression(s) / ED Diagnoses Final diagnoses:  Nausea vomiting and diarrhea  Epigastric pain    Rx / DC Orders ED Discharge Orders          Ordered    ondansetron (ZOFRAN-ODT) 4 MG disintegrating tablet  Every 8 hours PRN        04/11/22 1620    pantoprazole (PROTONIX) 20 MG tablet  Daily        04/11/22 1620              Peter Garter, [place] 04/11/22 1705    Jacalyn Lefevre, MD 04/12/22 1825

## 2022-12-08 ENCOUNTER — Emergency Department (HOSPITAL_COMMUNITY)
Admission: EM | Admit: 2022-12-08 | Discharge: 2022-12-08 | Disposition: A | Discharge status: Home / Self Care | Payer: Managed Care, Other (non HMO) | Attending: Emergency Medicine | Admitting: Emergency Medicine

## 2022-12-08 ENCOUNTER — Emergency Department (HOSPITAL_COMMUNITY): Payer: Managed Care, Other (non HMO)

## 2022-12-08 ENCOUNTER — Encounter (HOSPITAL_COMMUNITY): Payer: Self-pay

## 2022-12-08 ENCOUNTER — Other Ambulatory Visit: Payer: Self-pay

## 2022-12-08 DIAGNOSIS — R1013 Epigastric pain: Secondary | ICD-10-CM | POA: Diagnosis not present

## 2022-12-08 DIAGNOSIS — E876 Hypokalemia: Secondary | ICD-10-CM | POA: Insufficient documentation

## 2022-12-08 DIAGNOSIS — Z7984 Long term (current) use of oral hypoglycemic drugs: Secondary | ICD-10-CM | POA: Insufficient documentation

## 2022-12-08 DIAGNOSIS — F1210 Cannabis abuse, uncomplicated: Secondary | ICD-10-CM | POA: Insufficient documentation

## 2022-12-08 DIAGNOSIS — F1110 Opioid abuse, uncomplicated: Secondary | ICD-10-CM | POA: Insufficient documentation

## 2022-12-08 DIAGNOSIS — D72829 Elevated white blood cell count, unspecified: Secondary | ICD-10-CM | POA: Insufficient documentation

## 2022-12-08 DIAGNOSIS — R112 Nausea with vomiting, unspecified: Secondary | ICD-10-CM | POA: Diagnosis present

## 2022-12-08 DIAGNOSIS — E119 Type 2 diabetes mellitus without complications: Secondary | ICD-10-CM | POA: Insufficient documentation

## 2022-12-08 DIAGNOSIS — I10 Essential (primary) hypertension: Secondary | ICD-10-CM | POA: Diagnosis not present

## 2022-12-08 DIAGNOSIS — Z79899 Other long term (current) drug therapy: Secondary | ICD-10-CM | POA: Diagnosis not present

## 2022-12-08 LAB — CBC
HCT: 45.7 % (ref 39.0–52.0)
Hemoglobin: 15.6 g/dL (ref 13.0–17.0)
MCH: 28.6 pg (ref 26.0–34.0)
MCHC: 34.1 g/dL (ref 30.0–36.0)
MCV: 83.9 fL (ref 80.0–100.0)
Platelets: 366 10*3/uL (ref 150–400)
RBC: 5.45 MIL/uL (ref 4.22–5.81)
RDW: 13 % (ref 11.5–15.5)
WBC: 16 10*3/uL — ABNORMAL HIGH (ref 4.0–10.5)
nRBC: 0 % (ref 0.0–0.2)

## 2022-12-08 LAB — URINALYSIS, ROUTINE W REFLEX MICROSCOPIC
Bilirubin Urine: NEGATIVE
Glucose, UA: NEGATIVE mg/dL
Hgb urine dipstick: NEGATIVE
Ketones, ur: 80 mg/dL — AB
Leukocytes,Ua: NEGATIVE
Nitrite: NEGATIVE
Protein, ur: 30 mg/dL — AB
Specific Gravity, Urine: 1.023 (ref 1.005–1.030)
pH: 7 (ref 5.0–8.0)

## 2022-12-08 LAB — BASIC METABOLIC PANEL
Anion gap: 13 (ref 5–15)
Anion gap: 12 (ref 5–15)
BUN: 15 mg/dL (ref 6–20)
BUN: 14 mg/dL (ref 6–20)
CO2: 26 mmol/L (ref 22–32)
CO2: 23 mmol/L (ref 22–32)
Calcium: 9.3 mg/dL (ref 8.9–10.3)
Calcium: 9.1 mg/dL (ref 8.9–10.3)
Chloride: 99 mmol/L (ref 98–111)
Chloride: 102 mmol/L (ref 98–111)
Creatinine, Ser: 0.86 mg/dL (ref 0.61–1.24)
Creatinine, Ser: 0.8 mg/dL (ref 0.61–1.24)
GFR, Estimated: >60 mL/min (ref >60)
GFR, Estimated: >60 mL/min (ref >60)
Glucose, Bld: 114 mg/dL — ABNORMAL HIGH (ref 70–99)
Glucose, Bld: 98 mg/dL (ref 70–99)
Potassium: 2.7 mmol/L — CL (ref 3.5–5.1)
Potassium: 3.2 mmol/L — ABNORMAL LOW (ref 3.5–5.1)
Sodium: 138 mmol/L (ref 135–145)
Sodium: 137 mmol/L (ref 135–145)

## 2022-12-08 LAB — COMPREHENSIVE METABOLIC PANEL
ALT: 27 U/L (ref 0–44)
AST: 22 U/L (ref 15–41)
Albumin: 4.8 g/dL (ref 3.5–5.0)
Alkaline Phosphatase: 65 U/L (ref 38–126)
Anion gap: 17 — ABNORMAL HIGH (ref 5–15)
BUN: 17 mg/dL (ref 6–20)
CO2: 22 mmol/L (ref 22–32)
Calcium: 10 mg/dL (ref 8.9–10.3)
Chloride: 98 mmol/L (ref 98–111)
Creatinine, Ser: 0.97 mg/dL (ref 0.61–1.24)
GFR, Estimated: >60 mL/min (ref >60)
Glucose, Bld: 165 mg/dL — ABNORMAL HIGH (ref 70–99)
Potassium: 2.9 mmol/L — ABNORMAL LOW (ref 3.5–5.1)
Sodium: 137 mmol/L (ref 135–145)
Total Bilirubin: 1.2 mg/dL (ref 0.3–1.2)
Total Protein: 8.4 g/dL — ABNORMAL HIGH (ref 6.5–8.1)

## 2022-12-08 LAB — RAPID URINE DRUG SCREEN, HOSP PERFORMED
Amphetamines: NOT DETECTED
Barbiturates: NOT DETECTED
Benzodiazepines: NOT DETECTED
Cocaine: NOT DETECTED
Opiates: POSITIVE — AB
Tetrahydrocannabinol: POSITIVE — AB

## 2022-12-08 LAB — LIPASE, BLOOD: Lipase: 22 U/L (ref 11–51)

## 2022-12-08 LAB — MAGNESIUM: Magnesium: 1.7 mg/dL (ref 1.7–2.4)

## 2022-12-08 MED ORDER — POTASSIUM CHLORIDE 10 MEQ/100ML IV SOLN
10.0000 meq | Freq: Once | INTRAVENOUS | Status: AC
  Administered 2022-12-08: 10 meq via INTRAVENOUS
  Filled 2022-12-08: qty 100

## 2022-12-08 MED ORDER — POTASSIUM CHLORIDE CRYS ER 20 MEQ PO TBCR: 20.0000 meq | EXTENDED_RELEASE_TABLET | Freq: Every day | ORAL | 0 refills | Status: AC

## 2022-12-08 MED ORDER — ONDANSETRON HCL 4 MG PO TABS
4.0000 mg | ORAL_TABLET | Freq: Three times a day (TID) | ORAL | 0 refills | Status: AC | PRN
Start: 2022-12-08 — End: ?

## 2022-12-08 MED ORDER — LACTATED RINGERS IV BOLUS
1000.0000 mL | Freq: Once | INTRAVENOUS | Status: AC
  Administered 2022-12-08: 1000 mL via INTRAVENOUS

## 2022-12-08 MED ORDER — LACTATED RINGERS IV SOLN: INTRAVENOUS | Status: DC

## 2022-12-08 MED ORDER — PROMETHAZINE HCL 25 MG RE SUPP: 25.0000 mg | Freq: Four times a day (QID) | RECTAL | 0 refills | Status: AC | PRN

## 2022-12-08 MED ORDER — POTASSIUM CHLORIDE 10 MEQ/100ML IV SOLN: 10.0000 meq | Freq: Once | INTRAVENOUS | Status: DC

## 2022-12-08 MED ORDER — PROMETHAZINE HCL 25 MG PO TABS: 25.0000 mg | ORAL_TABLET | Freq: Four times a day (QID) | ORAL | 0 refills | Status: AC | PRN

## 2022-12-08 MED ORDER — MORPHINE SULFATE (PF) 4 MG/ML IV SOLN
4.0000 mg | Freq: Once | INTRAVENOUS | Status: AC
  Administered 2022-12-08: 4 mg via INTRAVENOUS
  Filled 2022-12-08: qty 1

## 2022-12-08 MED ORDER — ONDANSETRON HCL 4 MG/2ML IJ SOLN
4.0000 mg | Freq: Once | INTRAMUSCULAR | Status: AC | PRN
  Administered 2022-12-08: 4 mg via INTRAVENOUS
  Filled 2022-12-08: qty 2

## 2022-12-08 MED ORDER — POTASSIUM CHLORIDE 10 MEQ/100ML IV SOLN
10.0000 meq | INTRAVENOUS | Status: AC
  Administered 2022-12-08 (×2): 10 meq via INTRAVENOUS
  Filled 2022-12-08: qty 100

## 2022-12-08 MED ORDER — DROPERIDOL 2.5 MG/ML IJ SOLN
1.2500 mg | Freq: Once | INTRAMUSCULAR | Status: AC
  Administered 2022-12-08: 1.25 mg via INTRAVENOUS
  Filled 2022-12-08: qty 2

## 2022-12-08 MED ORDER — POTASSIUM CHLORIDE CRYS ER 20 MEQ PO TBCR
40.0000 meq | EXTENDED_RELEASE_TABLET | Freq: Once | ORAL | Status: AC
  Administered 2022-12-08: 40 meq via ORAL
  Filled 2022-12-08: qty 2

## 2022-12-08 MED ORDER — IOHEXOL 300 MG/ML  SOLN
100.0000 mL | Freq: Once | INTRAMUSCULAR | Status: AC | PRN
  Administered 2022-12-08: 100 mL via INTRAVENOUS

## 2022-12-08 NOTE — Discharge Instructions (Signed)
Take medications as prescribed for symptom control. Follow up with your doctor for recheck this week to insure you are improving.

## 2022-12-08 NOTE — ED Provider Notes (Signed)
[place] EMERGENCY DEPARTMENT AT [facility] Provider Note   CSN: [id number] Arrival date & time: 12/08/22  1343     History {Add pertinent medical, surgical, social history, OB history to HPI:1} Chief Complaint  Patient presents with   Emesis    Mark Small is a 27 y.o. male.   Emesis   27 year old male presents emergency department with complaints of upper abdominal pain, nausea, vomiting.  Patient states his symptoms began yesterday.  Reports feelings of nausea with decreased p.o. intake over the last week or so.  States that she recently began [place] 3 weeks ago and is concerned that it may be medication side effect.  Reports regular marijuana use as he has been prescribed "medical marijuana."  Reports as vomiting persisted through today, developed some pain in his chest.  States that he has been unable to tolerate anything by mouth for the past day and a half.  Denies any urinary symptoms.  Past medical history significant for diabetes mellitus, GERD, hyperlipidemia, hypertension, obesity.  Prior abdominal surgeries include appendectomy  Home Medications Prior to Admission medications   Medication Sig Start Date End Date Taking? Authorizing Provider  albuterol (PROVENTIL HFA;VENTOLIN HFA) 108 (90 Base) MCG/ACT inhaler Inhale into the lungs every 4 (four) hours as needed for wheezing or shortness of breath.    [provider]  chlorthalidone (HYGROTON) 25 MG tablet Take 25 mg by mouth every morning. 08/21/21   [provider]  colestipol (COLESTID) 1 g tablet Take 2 g by mouth daily. 08/02/21   [provider]  fexofenadine (ALLEGRA) 180 MG tablet Take 180 mg by mouth daily.      [provider]  lisinopril (ZESTRIL) 2.5 MG tablet Take 2.5 mg by mouth daily. 08/21/21   [provider]  metFORMIN (GLUCOPHAGE XR) 500 MG 24 hr tablet Take 2 tablets (1,000 mg total) by mouth 2 (two) times daily with a meal. Patient not  taking: Reported on 09/20/2017 03/22/11 03/21/12  Dessa Phi, MD  montelukast (SINGULAIR) 10 MG tablet Take 10 mg by mouth daily. 06/06/21   [provider]  ondansetron (ZOFRAN-ODT) 4 MG disintegrating tablet Take 1 tablet (4 mg total) by mouth every 8 (eight) hours as needed for nausea or vomiting. 04/11/22   Sherian Maroon A, PA  pantoprazole (PROTONIX) 20 MG tablet Take 1 tablet (20 mg total) by mouth daily. 04/11/22   Peter Garter, PA  pravastatin (PRAVACHOL) 20 MG tablet Take 20 mg by mouth daily. 08/21/21   [provider]  TRULICITY 3 MG/0.5ML SOPN Inject 0.5 mLs into the skin once a week. 09/13/21   [provider]  venlafaxine XR (EFFEXOR-XR) 75 MG 24 hr capsule Take 75 mg by mouth daily. 08/02/21   [provider]      Allergies    Patient has no known allergies.    Review of Systems   Review of Systems  Gastrointestinal:  Positive for vomiting.  All other systems reviewed and are negative.   Physical Exam Updated Vital Signs BP (!) 125/91 (BP Location: Right Arm)   Pulse 75   Temp 97.8 F (36.6 C) (Oral)   Resp 16   Ht 6' (1.829 m)   Wt (!) 167.8 kg   SpO2 100%   BMI 50.18 kg/m  Physical Exam Vitals and nursing note reviewed.  Constitutional:      General: He is not in acute distress.    Appearance: He is well-developed. He is obese. He is  ill-appearing.  HENT:     Head: Normocephalic and atraumatic.  Eyes:     Conjunctiva/sclera: Conjunctivae normal.  Cardiovascular:     Rate and Rhythm: Normal rate and regular rhythm.     Heart sounds: No murmur heard. Pulmonary:     Effort: Pulmonary effort is normal. No respiratory distress.     Breath sounds: Normal breath sounds.  Abdominal:     Palpations: Abdomen is soft.     Tenderness: There is abdominal tenderness. There is guarding. There is no right CVA tenderness or left CVA tenderness.     Comments: Epigastric tenderness to palpation.  Musculoskeletal:        General: No  swelling.     Cervical back: Neck supple.     Right lower leg: No edema.     Left lower leg: No edema.  Skin:    General: Skin is warm and dry.     Capillary Refill: Capillary refill takes less than 2 seconds.  Neurological:     Mental Status: He is alert.  Psychiatric:        Mood and Affect: Mood normal.     ED Results / Procedures / Treatments   Labs (all labs ordered are listed, but only abnormal results are displayed) Labs Reviewed  LIPASE, BLOOD  COMPREHENSIVE METABOLIC PANEL  CBC  URINALYSIS, ROUTINE W REFLEX MICROSCOPIC  MAGNESIUM  RAPID URINE DRUG SCREEN, HOSP PERFORMED    EKG None  Radiology No results found.  Procedures Procedures  {Document cardiac monitor, telemetry assessment procedure when appropriate:1}  Medications Ordered in ED Medications  ondansetron (ZOFRAN) injection 4 mg (has no administration in time range)  lactated ringers bolus 1,000 mL (has no administration in time range)    ED Course/ Medical Decision Making/ A&P   {   Click here for ABCD2, HEART and other calculatorsREFRESH Note before signing :1}                              Medical Decision Making Amount and/or Complexity of Data Reviewed Labs: ordered. Radiology: ordered.  Risk Prescription drug management.   This patient presents to the ED for concern of nausea, emesis, epigastric abdominal pain, this involves an extensive number of treatment options, and is a complaint that carries with it a high risk of complications and morbidity.  The differential diagnosis includes medication side effect, hyperemesis cannabinoid syndrome, gastritis, PUD, CBD pathology, SBO/LBO, volvulus, diverticulitis, appendicitis   Co morbidities that complicate the patient evaluation  See HPI   Additional history obtained:  Additional history obtained from EMR External records from outside source obtained and reviewed including hospital records   Lab Tests:  I Ordered, and personally  interpreted labs.  The pertinent results include: Leukocytosis of 16.  No evidence of anemia.  Placed within range.  Initial hypokalemia of 2.9.  No transaminitis.  No renal dysfunction.  Initial anion gap of 17.  UDS positive for opiates as well as THC.  UA with rare bacteria, 80 ketones and 30 proteins.  Repeat BMP with potassium of 2.7 and resolution of anion gap.   Imaging Studies ordered:  I ordered imaging studies including x-ray chest and abdomen, CT abdomen pelvis I independently visualized and interpreted imaging which showed no acute abnormality I agree with the radiologist interpretation   Cardiac Monitoring: / EKG:  The patient was maintained on a cardiac monitor.  I personally viewed and interpreted the cardiac monitored which showed  an underlying rhythm of: Sinus arrhythmia without QTc prolongation.  Nonspecific T wave changes   Consultations Obtained:  I requested consultation with attending physician Dr. Deretha Emory who is in agreement treatment plan going forward   Problem List / ED Course / Critical interventions / Medication management  Nausea/vomiting, hypokalemia, epigastric abdominal pain I ordered medication including to leave his normal saline, potassium chloride, morphine, Zofran, droperidol   Reevaluation of the patient after these medicines showed that the patient improved I have reviewed the patients home medicines and have made adjustments as needed   Social Determinants of Health:  Chronic marijuana use.  Denies tobacco use.  Alcohol use.   Test / Admission - Considered:  Nausea/vomiting, hypokalemia, epigastric abdominal pain Vitals signs within normal range and stable throughout visit. Laboratory/imaging studies significant for: See above *** Worrisome signs and symptoms were discussed with the patient, and the patient acknowledged understanding to return to the ED if noticed. Patient was stable upon discharge.    {Document critical care time  when appropriate:1} {Document review of labs and clinical decision tools ie heart score, Chads2Vasc2 etc:1}  {Document your independent review of radiology images, and any outside records:1} {Document your discussion with family members, caretakers, and with consultants:1} {Document social determinants of health affecting pt's care:1} {Document your decision making why or why not admission, treatments were needed:1} Final Clinical Impression(s) / ED Diagnoses Final diagnoses:  None    Rx / DC Orders ED Discharge Orders     None

## 2022-12-08 NOTE — ED Triage Notes (Signed)
Vomiting started yesterday on and off Today bile only Nausea and decreased appetite x1 week Liquid stool x1 week   MD changed to mounjaro 3 weeks ago

## 2022-12-08 NOTE — ED Notes (Signed)
Made aware we need urine sample, states he may be able to walk and stand up to go to the restroom after he gets his zofran

## 2022-12-08 NOTE — ED Provider Notes (Signed)
Hand off from [employer], PA-C, pending potassium repletion secondary to hyperemesis. Due to recheck at 7:30 pm. Vomiting controlled, tolerating PO fluids. Anticipate discharge home after potassium recheck.  Physical Exam  BP 113/70 (BP Location: Left Arm)   Pulse 94   Temp 98 F (36.7 C) (Oral)   Resp 15   Ht 6' (1.829 m)   Wt (!) 167.8 kg   SpO2 95%   BMI 50.18 kg/m     Procedures  Procedures  ED Course / MDM    Medical Decision Making Amount and/or Complexity of Data Reviewed Labs: ordered. Radiology: ordered.  Risk Prescription drug management.   Recheck of potassium shows significant improvement. Patient can be discharged per plan of previous treatment team.        Elpidio Anis, PA-C 12/08/22 2045    Vanetta Mulders, MD 12/09/22 2326

## 2022-12-11 MED FILL — Ondansetron HCl Tab 4 MG: ORAL | Qty: 4 | Status: AC

## 2023-12-04 IMAGING — CT CT ABD-PELV W/ CM
2 of 4 series · 16 of 46 positions shown, 18 images · IV contrast (Omnipaque or Isovue)
Comparison: None Available.

CLINICAL DATA: Acute abdominal pain for several days, initial
encounter

EXAM:
CT ABDOMEN AND PELVIS WITH CONTRAST
TECHNIQUE: Multidetector CT imaging of the abdomen and pelvis was performed
using the standard protocol following bolus administration of
intravenous contrast.

[Series 2: axial st · axial · 0.98mm/px · z∈[+779,+1329]mm · 13 of 122 slices shown, 15 images]
[im 6/122  soft-tissue]
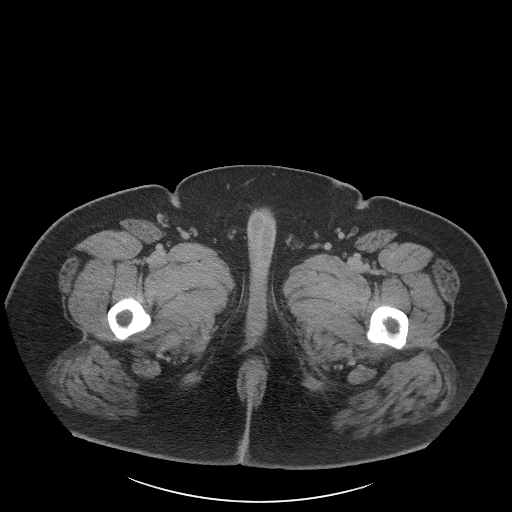
[im 6/122  bone]
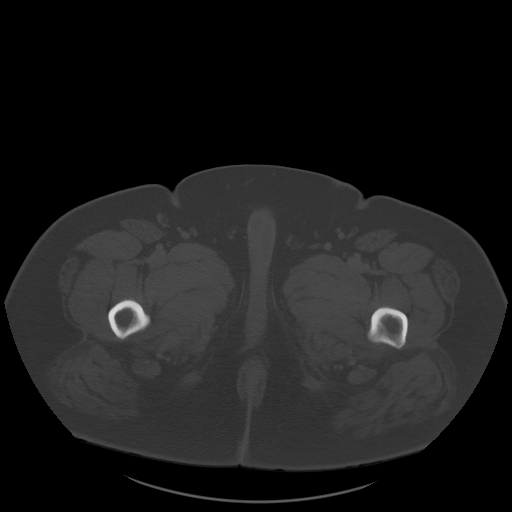
[im 16/122  soft-tissue]
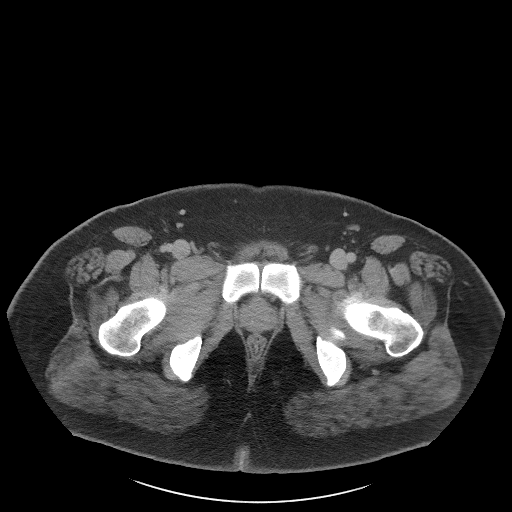
[im 27/122  soft-tissue]
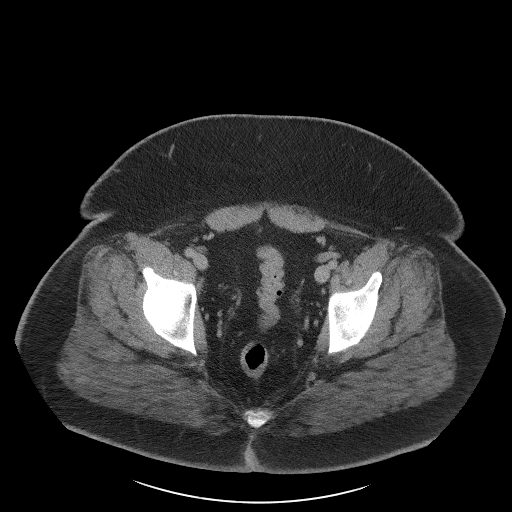
[im 32/122  soft-tissue]
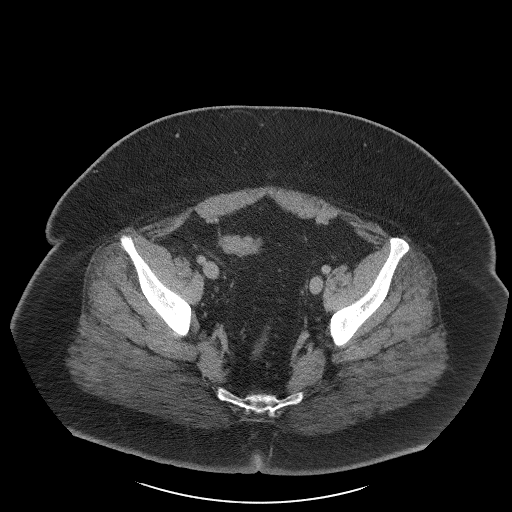
[im 43/122  soft-tissue]
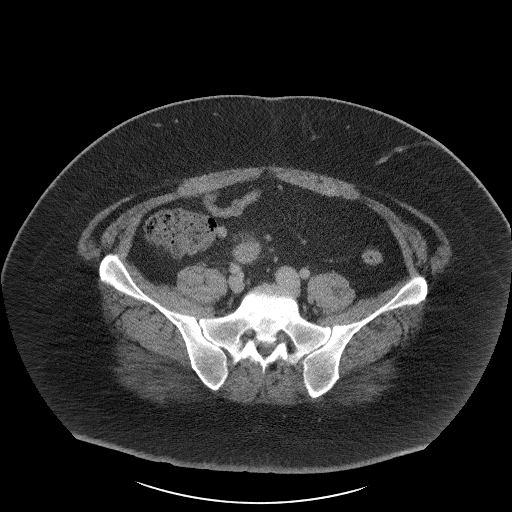
[im 53/122  soft-tissue]
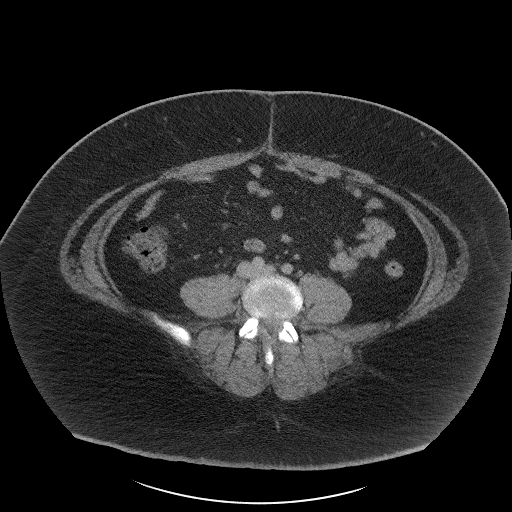
[im 64/122  soft-tissue]
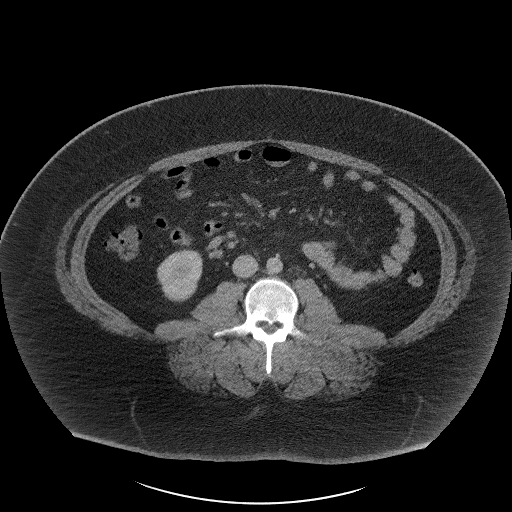
[im 69/122  soft-tissue]
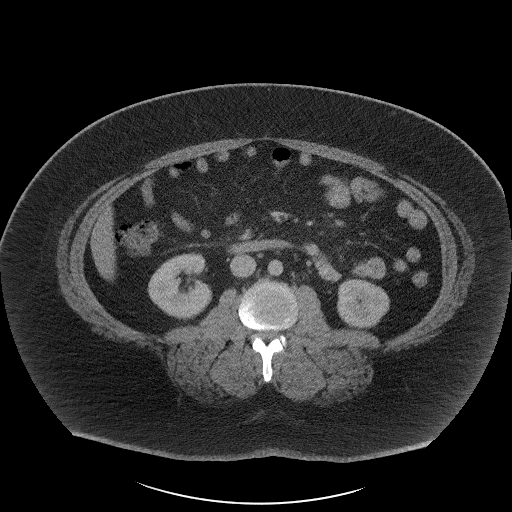
[im 79/122  soft-tissue]
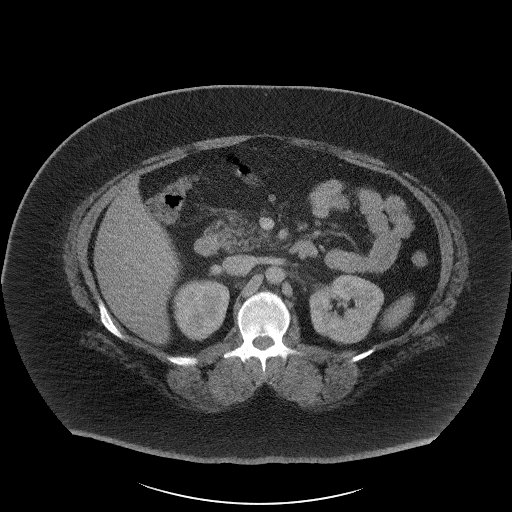
[im 79/122  bone]
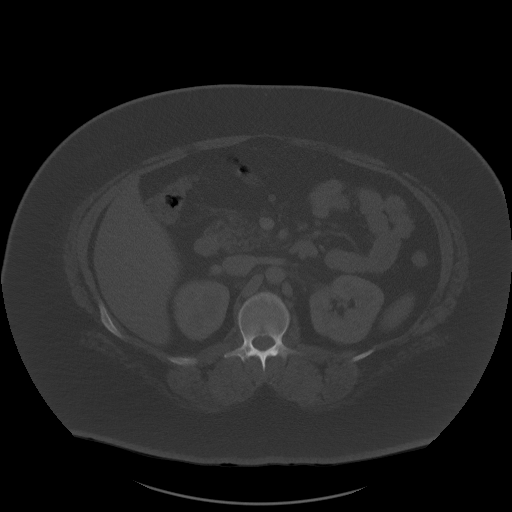
[im 90/122  soft-tissue]
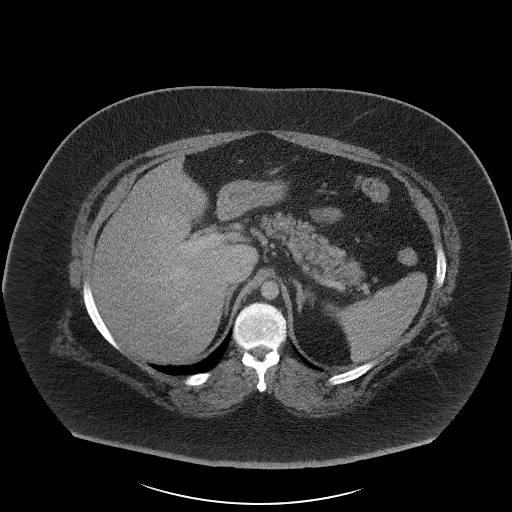
[im 95/122  soft-tissue]
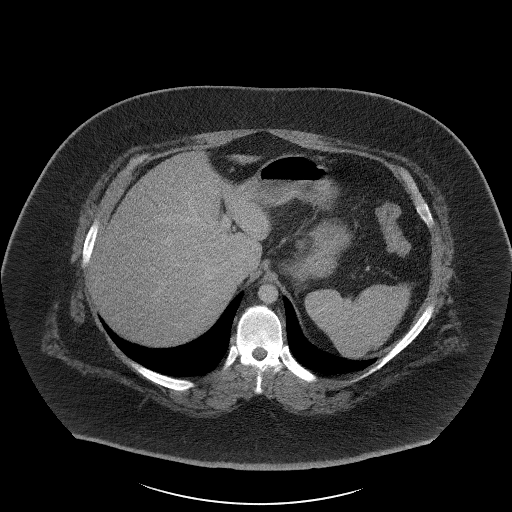
[im 106/122  soft-tissue]
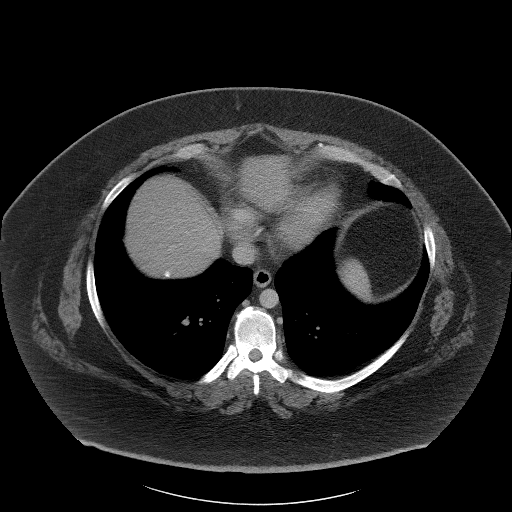
[im 116/122  soft-tissue]
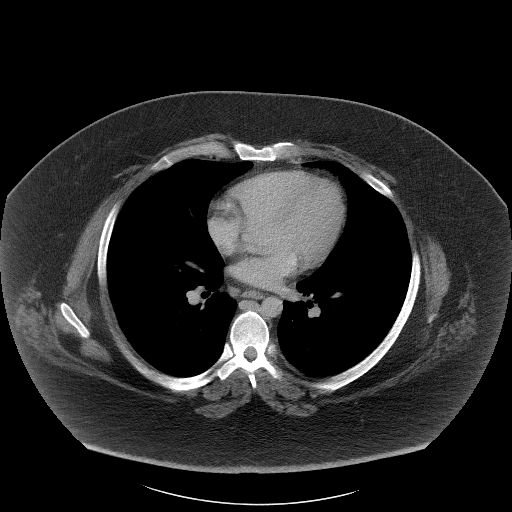

[Series 5: coronal st · coronal · 1.18mm/px · 3 of 147 slices shown]
[im 49/147  soft-tissue]
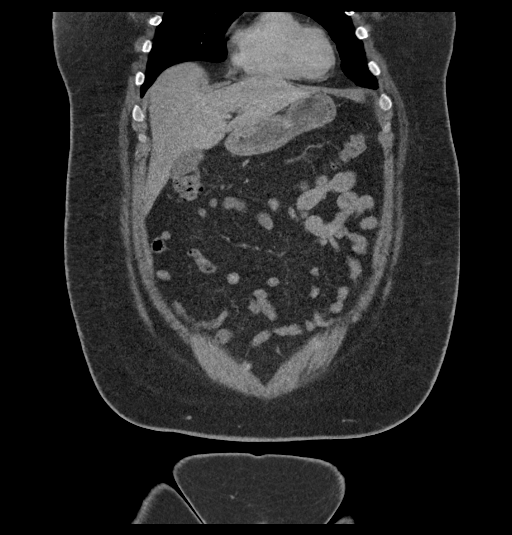
[im 65/147  soft-tissue]
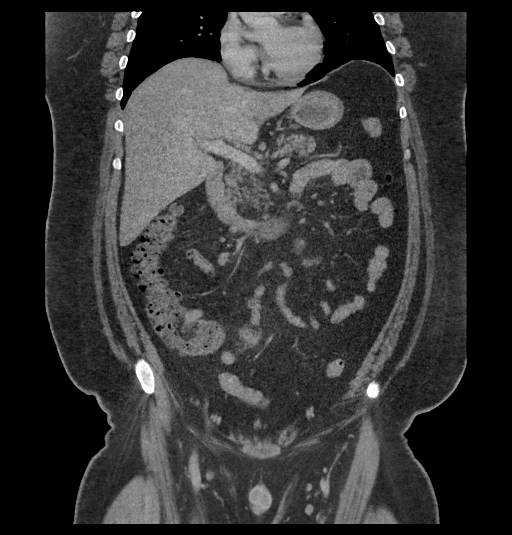
[im 82/147  soft-tissue]
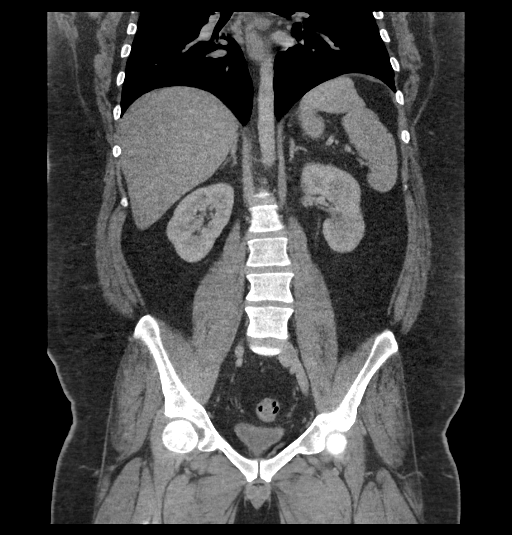

[16 of 46 positions shown; findings below may reference images not displayed]

RADIATION DOSE REDUCTION: This exam was performed according to the
departmental dose-optimization program which includes automated
exposure control, adjustment of the mA and/or kV according to
patient size and/or use of iterative reconstruction technique.

CONTRAST:  100mL OMNIPAQUE IOHEXOL 300 MG/ML  SOLN
FINDINGS: Lower chest: No acute abnormality.

Hepatobiliary: Mild fatty infiltration of the liver is noted.
Gallbladder is within normal limits.

Pancreas: Unremarkable. No pancreatic ductal dilatation or
surrounding inflammatory changes.

Spleen: Normal in size without focal abnormality.

Adrenals/Urinary Tract: Adrenal glands are within normal limits. No
renal calculi or obstructive changes are seen. The ureters are
unremarkable. The bladder is decompressed.

Stomach/Bowel: Colon is predominately decompressed. No obstructive
or inflammatory changes are seen. The appendix is well visualized
and dilated to up to 20 mm at the distal tip with some Peri
appendiceal inflammatory changes consistent with appendicitis. No
perforation or abscess is noted at this time. Small bowel and
stomach are within normal limits.

Vascular/Lymphatic: No significant vascular findings are present. No
enlarged abdominal or pelvic lymph nodes.

Reproductive: Prostate is unremarkable.

Other: No abdominal wall hernia or abnormality. No abdominopelvic
ascites.

Musculoskeletal: No acute or significant osseous findings.
IMPRESSION: Changes consistent with peripheral appendicitis without evidence of
perforation or abscess formation.

Fatty liver.

No other focal abnormality is noted.
